# Patient Record
Sex: Female | Born: 1990 | Race: White | Hispanic: No | Marital: Married | Smoking: Never smoker
Health system: Southern US, Community
[De-identification: ages and names within clinical notes are randomized; demographics above are authoritative.]

## PROBLEM LIST (undated history)

## (undated) ENCOUNTER — Inpatient Hospital Stay (HOSPITAL_COMMUNITY): Payer: Self-pay

## (undated) DIAGNOSIS — D649 Anemia, unspecified: Secondary | ICD-10-CM

## (undated) DIAGNOSIS — R51 Headache: Secondary | ICD-10-CM

## (undated) DIAGNOSIS — O24419 Gestational diabetes mellitus in pregnancy, unspecified control: Secondary | ICD-10-CM

## (undated) DIAGNOSIS — Z8619 Personal history of other infectious and parasitic diseases: Secondary | ICD-10-CM

## (undated) DIAGNOSIS — F419 Anxiety disorder, unspecified: Secondary | ICD-10-CM

## (undated) HISTORY — DX: Gestational diabetes mellitus in pregnancy, unspecified control: O24.419

## (undated) HISTORY — DX: Anemia, unspecified: D64.9

## (undated) HISTORY — PX: DILATION AND CURETTAGE OF UTERUS: SHX78

## (undated) HISTORY — DX: Personal history of other infectious and parasitic diseases: Z86.19

## (undated) HISTORY — DX: Headache: R51

---
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
## 2012-01-09 NOTE — L&D Delivery Note (Signed)
Delivery Note At 11:10 AM a viable female was delivered via Vaginal, Spontaneous Delivery (Presentation: Left Occiput Anterior).  APGAR: 7, 8; weight 8 lb 5 oz (3771 g).   Placenta status: Intact, Spontaneous.  Cord: 3 vessels with the following complications: None.  Cord pH: NA  Anesthesia: Epidural  Episiotomy: None Lacerations: 1st degree;Perineal .. And right labial  Suture Repair: 3.0 vicryl Est. Blood Loss (mL): 250  Mom to postpartum.  Baby to nursery-stable.  Dana Bass J. 10/03/2012, 1:09 PM

## 2012-04-03 LAB — OB RESULTS CONSOLE RUBELLA ANTIBODY, IGM: Rubella: IMMUNE

## 2012-04-15 ENCOUNTER — Inpatient Hospital Stay (HOSPITAL_COMMUNITY): Admission: AD | Admit: 2012-04-15 | Payer: Self-pay | Source: Ambulatory Visit | Admitting: Obstetrics and Gynecology

## 2012-04-18 ENCOUNTER — Ambulatory Visit (HOSPITAL_COMMUNITY)
Admission: RE | Admit: 2012-04-18 | Discharge: 2012-04-18 | Disposition: A | Discharge status: Home / Self Care | Payer: Managed Care, Other (non HMO) | Source: Ambulatory Visit | Attending: Obstetrics and Gynecology | Admitting: Obstetrics and Gynecology

## 2012-04-18 ENCOUNTER — Other Ambulatory Visit: Payer: Self-pay | Admitting: Obstetrics and Gynecology

## 2012-04-18 DIAGNOSIS — N949 Unspecified condition associated with female genital organs and menstrual cycle: Secondary | ICD-10-CM

## 2012-04-18 DIAGNOSIS — Z3689 Encounter for other specified antenatal screening: Secondary | ICD-10-CM | POA: Insufficient documentation

## 2012-04-18 DIAGNOSIS — R109 Unspecified abdominal pain: Secondary | ICD-10-CM

## 2012-04-18 DIAGNOSIS — O2620 Pregnancy care for patient with recurrent pregnancy loss, unspecified trimester: Secondary | ICD-10-CM | POA: Insufficient documentation

## 2012-05-21 ENCOUNTER — Other Ambulatory Visit: Payer: Self-pay | Admitting: Obstetrics and Gynecology

## 2012-05-21 ENCOUNTER — Encounter (HOSPITAL_COMMUNITY): Payer: Self-pay | Admitting: Obstetrics and Gynecology

## 2012-05-21 DIAGNOSIS — O358XX1 Maternal care for other (suspected) fetal abnormality and damage, fetus 1: Secondary | ICD-10-CM

## 2012-05-27 ENCOUNTER — Telehealth (HOSPITAL_COMMUNITY): Payer: Self-pay | Admitting: MS"

## 2012-05-27 NOTE — Telephone Encounter (Signed)
During appointment reminder calls, patient's husband inquired about the necessity of the appointment. I called back to discuss further. He stated that he currently does not have insurance, so while they are interested in the appointment, he is trying to weigh if the visit is worth the additional cost. He stated that they are not concerned about the club foot, but if ultrasound is looking at the calcifications in the heart, then they would be interested. We discussed that the anatomy ultrasound will be looking overall at the anatomy and growth of the baby. We briefly discussed echogenic intracardiac focus does not indicate a problem with the fetal heart and that no follow-up/treatment for an EIF is required. We briefly discussed that EIF is a marker for chromosome conditions, but that is considered a stronger marker in pregnancies that are already considered to have an increased risk for these conditions. Mr. Granada expressed that he and his wife are not concerned about the possibility of a genetic condition in the pregnancy. He plans to talk with his wife, but given the expense for additional appointments currently, they may plan to follow-up with their OB only at this time. They plan to call our office back today if they would like to cancel the appointment tomorrow.   Clydie Braun Taylormarie Register 05/27/2012 9:52 AM

## 2012-05-28 ENCOUNTER — Ambulatory Visit (HOSPITAL_COMMUNITY): Payer: Managed Care, Other (non HMO)

## 2012-07-05 ENCOUNTER — Inpatient Hospital Stay (HOSPITAL_COMMUNITY)
Admission: AD | Admit: 2012-07-05 | Discharge: 2012-07-05 | Disposition: A | Discharge status: Home / Self Care | Payer: 59 | Source: Ambulatory Visit | Attending: Obstetrics and Gynecology | Admitting: Obstetrics and Gynecology

## 2012-07-05 ENCOUNTER — Encounter (HOSPITAL_COMMUNITY): Payer: Self-pay | Admitting: *Deleted

## 2012-07-05 DIAGNOSIS — N390 Urinary tract infection, site not specified: Secondary | ICD-10-CM | POA: Insufficient documentation

## 2012-07-05 DIAGNOSIS — O2390 Unspecified genitourinary tract infection in pregnancy, unspecified trimester: Secondary | ICD-10-CM | POA: Insufficient documentation

## 2012-07-05 DIAGNOSIS — B373 Candidiasis of vulva and vagina: Secondary | ICD-10-CM

## 2012-07-05 DIAGNOSIS — O4702 False labor before 37 completed weeks of gestation, second trimester: Secondary | ICD-10-CM

## 2012-07-05 DIAGNOSIS — M545 Low back pain, unspecified: Secondary | ICD-10-CM | POA: Insufficient documentation

## 2012-07-05 DIAGNOSIS — R109 Unspecified abdominal pain: Secondary | ICD-10-CM | POA: Insufficient documentation

## 2012-07-05 DIAGNOSIS — N949 Unspecified condition associated with female genital organs and menstrual cycle: Secondary | ICD-10-CM | POA: Insufficient documentation

## 2012-07-05 DIAGNOSIS — O2342 Unspecified infection of urinary tract in pregnancy, second trimester: Secondary | ICD-10-CM

## 2012-07-05 LAB — URINALYSIS, ROUTINE W REFLEX MICROSCOPIC
Glucose, UA: NEGATIVE mg/dL
Ketones, ur: NEGATIVE mg/dL
Nitrite: NEGATIVE
Protein, ur: NEGATIVE mg/dL

## 2012-07-05 LAB — OB RESULTS CONSOLE HIV ANTIBODY (ROUTINE TESTING): HIV: NONREACTIVE

## 2012-07-05 LAB — URINE MICROSCOPIC-ADD ON

## 2012-07-05 LAB — WET PREP, GENITAL: Trich, Wet Prep: NONE SEEN

## 2012-07-05 LAB — OB RESULTS CONSOLE RPR: RPR: NONREACTIVE

## 2012-07-05 LAB — OB RESULTS CONSOLE GC/CHLAMYDIA: Gonorrhea: NEGATIVE

## 2012-07-05 LAB — OB RESULTS CONSOLE ANTIBODY SCREEN: Antibody Screen: NEGATIVE

## 2012-07-05 LAB — AMNISURE RUPTURE OF MEMBRANE (ROM) NOT AT ARMC: Amnisure ROM: NEGATIVE

## 2012-07-05 MED ORDER — TERCONAZOLE 0.4 % VA CREA: 1.0000 | TOPICAL_CREAM | Freq: Every day | VAGINAL | Status: DC

## 2012-07-05 MED ORDER — NITROFURANTOIN MONOHYD MACRO 100 MG PO CAPS: 100.0000 mg | ORAL_CAPSULE | Freq: Two times a day (BID) | ORAL | Status: DC

## 2012-07-05 NOTE — MAU Provider Note (Signed)
History     CSN: [id number]  Arrival date and time: 07/05/12 1503   None     Chief Complaint  Patient presents with  . Abdominal Cramping  . Vaginal Discharge   HPI Dana Bass is a 22 y.o. G1P0 at [redacted]w[redacted]d. She presents with c/o leaking watery cloudy discharge this am. No bleeding, started having cramping this am also. She has had low back ache past few days. Good fetal movement.   OB History   Grav Para Term Preterm Abortions TAB SAB Ect Mult Living   1               History reviewed. No pertinent past medical history.  History reviewed. No pertinent past surgical history.  No family history on file.  History  Substance Use Topics  . Smoking status: Not on file  . Smokeless tobacco: Not on file  . Alcohol Use: Not on file    Allergies: Allergies not on file  No prescriptions prior to admission    Review of Systems  Constitutional: Negative for fever and chills.  Gastrointestinal: Positive for constipation. Negative for nausea, vomiting and diarrhea.  Genitourinary: Negative for dysuria, urgency, frequency, hematuria and flank pain.       Watery cloudy vaginal discharge No bleeding   Physical Exam   Blood pressure 131/69, pulse 116, temperature 98.5 F (36.9 C), temperature source Oral, resp. rate 18, height 4\' 11"  (1.499 m), weight 155 lb (70.308 kg).  Physical Exam  Constitutional: She is oriented to person, place, and time. She appears well-developed and well-nourished.  GI: Soft. She exhibits no distension. There is no tenderness. There is no rebound and no guarding.  Genitourinary:  SSE- Vulva- nl anatomy,skin intact Vagina- small amt white chunky discharge Cx- closed, long Uterus- Gravid 26-28 wk size, non tender Adn- non-tender  Musculoskeletal: Normal range of motion.  Neurological: She is alert and oriented to person, place, and time.  Skin: Skin is warm and dry.  Psychiatric: She has a normal mood and affect. Her behavior is normal.    MAU  Course  Procedures  MDM Results for orders placed during the hospital encounter of 07/05/12 (from the past 24 hour(s))  OB RESULTS CONSOLE GC/CHLAMYDIA     Status: None   Collection Time    07/05/12 12:00 AM      Result Value Range   Gonorrhea Negative     Chlamydia Negative    OB RESULTS CONSOLE RPR     Status: None   Collection Time    07/05/12 12:00 AM      Result Value Range   RPR Nonreactive    OB RESULTS CONSOLE HIV ANTIBODY (ROUTINE TESTING)     Status: None   Collection Time    07/05/12 12:00 AM      Result Value Range   HIV Non-reactive    OB RESULTS CONSOLE HEPATITIS B SURFACE ANTIGEN     Status: None   Collection Time    07/05/12 12:00 AM      Result Value Range   Hepatitis B Surface Ag Negative    OB RESULTS CONSOLE ABO/RH     Status: None   Collection Time    07/05/12 12:00 AM      Result Value Range   RH Type  Negative     ABO Grouping O    OB RESULTS CONSOLE ANTIBODY SCREEN     Status: None   Collection Time    07/05/12 12:00 AM  Result Value Range   Antibody Screen Negative    URINALYSIS, ROUTINE W REFLEX MICROSCOPIC     Status: Abnormal   Collection Time    07/05/12  3:20 PM      Result Value Range   Color, Urine YELLOW  YELLOW   APPearance HAZY (*) CLEAR   Specific Gravity, Urine 1.015  1.005 - 1.030   pH 8.0  5.0 - 8.0   Glucose, UA NEGATIVE  NEGATIVE mg/dL   Hgb urine dipstick NEGATIVE  NEGATIVE   Bilirubin Urine NEGATIVE  NEGATIVE   Ketones, ur NEGATIVE  NEGATIVE mg/dL   Protein, ur NEGATIVE  NEGATIVE mg/dL   Urobilinogen, UA 0.2  0.0 - 1.0 mg/dL   Nitrite NEGATIVE  NEGATIVE   Leukocytes, UA MODERATE (*) NEGATIVE  URINE MICROSCOPIC-ADD ON     Status: Abnormal   Collection Time    07/05/12  3:20 PM      Result Value Range   Squamous Epithelial / LPF MANY (*) RARE   WBC, UA 7-10  <3 WBC/hpf   Bacteria, UA RARE  RARE   Urine-Other MUCOUS PRESENT    WET PREP, GENITAL     Status: Abnormal   Collection Time    07/05/12  4:30 PM       Result Value Range   Yeast Wet Prep HPF POC MODERATE (*) NONE SEEN   Trich, Wet Prep NONE SEEN  NONE SEEN   Clue Cells Wet Prep HPF POC FEW (*) NONE SEEN   WBC, Wet Prep HPF POC MODERATE (*) NONE SEEN  AMNISURE RUPTURE OF MEMBRANE (ROM)     Status: None   Collection Time    07/05/12  4:30 PM      Result Value Range   Amnisure ROM NEGATIVE       Assessment and Plan  ASSESSMENT:  Vaginal yeast Urinary tract infection  26 3/67 wks, not ruptured  PLAN:  C&S on urine Macrobid bid x 7d Terazol 7 cream Keep her appt 7/2 in the office with Dr Dawayne Patricia, [place]. 07/05/2012, 3:49 PM

## 2012-07-05 NOTE — MAU Note (Signed)
Pt reports she has noticed some watery discharge today and some mild/ moderate abd cramping. Stated she has been having intermittent back pain for several days.

## 2012-07-05 NOTE — Progress Notes (Signed)
Specimens recollected

## 2012-07-05 NOTE — MAU Note (Signed)
Pt presents with occassional leaking since about 1245 today and cramping  On and off for a few days.  Pt with occassional headaches through out the pregnancy.

## 2012-07-06 ENCOUNTER — Telehealth: Payer: Self-pay | Admitting: Obstetrics and Gynecology

## 2012-07-06 NOTE — Telephone Encounter (Signed)
Phone call from pt.  Pt c/o intermittent back pain as well as cramping occurring approximately every 20 minutes since last night with increased intensity.  Also complains of watery vaginal discharge which has run down her legs on several occasions since yesterday.  Denies fever, vag itching, burning or odor.  Reports active fetus.  Pt instructed to present to MAU for evaluation.

## 2012-07-07 LAB — GC/CHLAMYDIA PROBE AMP
CT Probe RNA: NEGATIVE
GC Probe RNA: NEGATIVE

## 2012-07-08 ENCOUNTER — Encounter (HOSPITAL_COMMUNITY): Payer: Self-pay

## 2012-07-08 ENCOUNTER — Ambulatory Visit (HOSPITAL_COMMUNITY)
Admission: RE | Admit: 2012-07-08 | Discharge: 2012-07-08 | Disposition: A | Discharge status: Home / Self Care | Payer: 59 | Source: Ambulatory Visit | Attending: Obstetrics and Gynecology | Admitting: Obstetrics and Gynecology

## 2012-07-08 ENCOUNTER — Ambulatory Visit (HOSPITAL_COMMUNITY): Admission: RE | Admit: 2012-07-08 | Payer: 59 | Source: Ambulatory Visit

## 2012-07-08 DIAGNOSIS — O358XX1 Maternal care for other (suspected) fetal abnormality and damage, fetus 1: Secondary | ICD-10-CM

## 2012-07-08 DIAGNOSIS — O358XX0 Maternal care for other (suspected) fetal abnormality and damage, not applicable or unspecified: Secondary | ICD-10-CM | POA: Insufficient documentation

## 2012-07-08 DIAGNOSIS — Z363 Encounter for antenatal screening for malformations: Secondary | ICD-10-CM | POA: Insufficient documentation

## 2012-07-08 DIAGNOSIS — O360990 Maternal care for other rhesus isoimmunization, unspecified trimester, not applicable or unspecified: Secondary | ICD-10-CM | POA: Insufficient documentation

## 2012-07-08 DIAGNOSIS — O2620 Pregnancy care for patient with recurrent pregnancy loss, unspecified trimester: Secondary | ICD-10-CM | POA: Insufficient documentation

## 2012-07-08 DIAGNOSIS — Z1389 Encounter for screening for other disorder: Secondary | ICD-10-CM | POA: Insufficient documentation

## 2012-08-06 ENCOUNTER — Encounter: Payer: 59 | Attending: Obstetrics and Gynecology

## 2012-08-12 ENCOUNTER — Other Ambulatory Visit: Payer: Self-pay | Admitting: Obstetrics and Gynecology

## 2012-08-12 DIAGNOSIS — Q6601 Congenital talipes equinovarus, right foot: Secondary | ICD-10-CM

## 2012-08-12 DIAGNOSIS — R9389 Abnormal findings on diagnostic imaging of other specified body structures: Secondary | ICD-10-CM

## 2012-09-02 ENCOUNTER — Ambulatory Visit (HOSPITAL_COMMUNITY)
Admission: RE | Admit: 2012-09-02 | Discharge: 2012-09-02 | Disposition: A | Discharge status: Home / Self Care | Payer: 59 | Source: Ambulatory Visit | Attending: Obstetrics and Gynecology | Admitting: Obstetrics and Gynecology

## 2012-09-02 ENCOUNTER — Encounter (HOSPITAL_COMMUNITY): Payer: Self-pay

## 2012-09-02 DIAGNOSIS — R9389 Abnormal findings on diagnostic imaging of other specified body structures: Secondary | ICD-10-CM

## 2012-09-02 DIAGNOSIS — Q6601 Congenital talipes equinovarus, right foot: Secondary | ICD-10-CM

## 2012-09-02 DIAGNOSIS — IMO0002 Reserved for concepts with insufficient information to code with codable children: Secondary | ICD-10-CM | POA: Insufficient documentation

## 2012-09-02 DIAGNOSIS — O358XX0 Maternal care for other (suspected) fetal abnormality and damage, not applicable or unspecified: Secondary | ICD-10-CM | POA: Insufficient documentation

## 2012-09-02 NOTE — Progress Notes (Signed)
Dana Bass  was seen today for an ultrasound appointment.  See full report in [facility].  Impression: Single IUP at 34 6/7 weeks Bilateral clubfeet Mild right renal pylectrasis (7.3 cm) without calyceal dilation The estimated fetal weight today is > 90th %tile (3111g).  The AC measures > 97th %tile. Normal amniotic fluid volume  Recommendations: Follow-up ultrasounds as clinically indicated.  Recommend renal ultarsound of the newborn after delivery. Patient plans for Peds ortho evaluation following delivery.  Alpha Gula, MD

## 2012-09-04 ENCOUNTER — Inpatient Hospital Stay (HOSPITAL_COMMUNITY)
Admission: AD | Admit: 2012-09-04 | Discharge: 2012-09-05 | Disposition: A | Discharge status: Home / Self Care | Payer: 59 | Source: Ambulatory Visit | Attending: Obstetrics and Gynecology | Admitting: Obstetrics and Gynecology

## 2012-09-04 ENCOUNTER — Encounter (HOSPITAL_COMMUNITY): Payer: Self-pay | Admitting: *Deleted

## 2012-09-04 DIAGNOSIS — O2343 Unspecified infection of urinary tract in pregnancy, third trimester: Secondary | ICD-10-CM

## 2012-09-04 DIAGNOSIS — N390 Urinary tract infection, site not specified: Secondary | ICD-10-CM | POA: Insufficient documentation

## 2012-09-04 DIAGNOSIS — O2390 Unspecified genitourinary tract infection in pregnancy, unspecified trimester: Secondary | ICD-10-CM | POA: Insufficient documentation

## 2012-09-04 DIAGNOSIS — O4703 False labor before 37 completed weeks of gestation, third trimester: Secondary | ICD-10-CM

## 2012-09-04 DIAGNOSIS — O4700 False labor before 37 completed weeks of gestation, unspecified trimester: Secondary | ICD-10-CM | POA: Insufficient documentation

## 2012-09-04 DIAGNOSIS — R109 Unspecified abdominal pain: Secondary | ICD-10-CM | POA: Insufficient documentation

## 2012-09-04 DIAGNOSIS — O358XX0 Maternal care for other (suspected) fetal abnormality and damage, not applicable or unspecified: Secondary | ICD-10-CM | POA: Insufficient documentation

## 2012-09-04 HISTORY — DX: Anxiety disorder, unspecified: F41.9

## 2012-09-04 LAB — URINALYSIS, ROUTINE W REFLEX MICROSCOPIC
Bilirubin Urine: NEGATIVE
Nitrite: NEGATIVE
Specific Gravity, Urine: >1.03 — ABNORMAL HIGH (ref 1.005–1.030)
pH: 6 (ref 5.0–8.0)

## 2012-09-04 LAB — URINE MICROSCOPIC-ADD ON

## 2012-09-04 NOTE — MAU Provider Note (Signed)
Chief Complaint:  No chief complaint on file.  First Provider Initiated Contact with Patient 09/04/12 2311     HPI: Dana Bass is a 22 y.o. G4P0030 at [redacted]w[redacted]d who presents to maternity admissions reporting low abdominal pressure and questionable contractions since 7 PM. Denies leaking of fluid, vaginal bleeding, urinary complaints or vaginal discharge. No previous issues with preterm labor. Good fetal movement.   Pregnancy Course: Followed by MFM for fetal pyelectasis, suspected fetal macrosomia and bilateral clubfeet. History of frequent UTIs.  Past Medical History: Past Medical History  Diagnosis Date  . Anxiety     Past obstetric history: OB History  Gravida Para Term Preterm AB SAB TAB Ectopic Multiple Living  4    3 3         # Outcome Date GA Lbr Len/2nd Weight Sex Delivery Anes PTL Lv  4 CUR           3 SAB           2 SAB           1 SAB               Past Surgical History: Past Surgical History  Procedure Laterality Date  . Dilation and curettage of uterus    . Cholecystectomy  2010     Family History: History reviewed. No pertinent family history.  Social History: History  Substance Use Topics  . Smoking status: Never Smoker   . Smokeless tobacco: Not on file  . Alcohol Use: No    Allergies:  Allergies  Allergen Reactions  . Morphine And Related Nausea Only  . Sulfa Antibiotics Other (See Comments)    Unknown childhood rxn    Meds:  Prescriptions prior to admission  Medication Sig Dispense Refill  . acetaminophen (TYLENOL) 500 MG tablet Take 1,000 mg by mouth every 6 (six) hours as needed for pain.      Marland Kitchen FLUoxetine (PROZAC) 20 MG tablet Take 20 mg by mouth daily.      . Prenatal Vit-Fe Fumarate-FA (PRENATAL MULTIVITAMIN) TABS Take 1 tablet by mouth daily at 12 noon.      . [DISCONTINUED] nitrofurantoin, macrocrystal-monohydrate, (MACROBID) 100 MG capsule Take 1 capsule (100 mg total) by mouth 2 (two) times daily.  14 capsule  0  . [DISCONTINUED]  terconazole (TERAZOL 7) 0.4 % vaginal cream Place 1 applicator vaginally at bedtime.  45 g  0    ROS: Pertinent findings in history of present illness.  Physical Exam  Blood pressure 115/71, pulse 109, temperature 98 F (36.7 C), temperature source Oral, resp. rate 18, height 4\' 11"  (1.499 m), weight 78.019 kg (172 lb), last menstrual period 01/09/2012. GENERAL: Well-developed, well-nourished female in mild distress.  HEENT: normocephalic HEART: normal rate RESP: normal effort ABDOMEN: Soft, non-tender, gravid appropriate for gestational age. No CVA tenderness. EXTREMITIES: Nontender, no edema NEURO: alert and oriented SPECULUM EXAM: Deferred Dilation: Closed Effacement (%): Thick Cervical Position: Posterior Station: -3 Presentation: Vertex No bloody show. Exam by:: V. Jnaya Butrick,CNM  FHT:  Baseline 140, moderate variability, accelerations present, no decelerations Contractions: Frequent uterine irritability   Labs: Results for orders placed during the hospital encounter of 09/04/12 (from the past 24 hour(s))  URINALYSIS, ROUTINE W REFLEX MICROSCOPIC     Status: Abnormal   Collection Time    09/04/12  8:11 PM      Result Value Range   Color, Urine YELLOW  YELLOW   APPearance CLEAR  CLEAR   Specific Gravity, Urine >1.030 (*)  1.005 - 1.030   pH 6.0  5.0 - 8.0   Glucose, UA NEGATIVE  NEGATIVE mg/dL   Hgb urine dipstick LARGE (*) NEGATIVE   Bilirubin Urine NEGATIVE  NEGATIVE   Ketones, ur NEGATIVE  NEGATIVE mg/dL   Protein, ur 454 (*) NEGATIVE mg/dL   Urobilinogen, UA 0.2  0.0 - 1.0 mg/dL   Nitrite NEGATIVE  NEGATIVE   Leukocytes, UA SMALL (*) NEGATIVE  URINE MICROSCOPIC-ADD ON     Status: Abnormal   Collection Time    09/04/12  8:11 PM      Result Value Range   Squamous Epithelial / LPF RARE  RARE   WBC, UA 0-2  <3 WBC/hpf   RBC / HPF TOO NUMEROUS TO COUNT  <3 RBC/hpf   Bacteria, UA FEW (*) RARE   Crystals CA OXALATE CRYSTALS (*) NEGATIVE    Imaging:  NA  MAU  Course: Cramping improved with oral fluids.  Assessment: 1. Preterm contractions, third trimester   2. UTI in pregnancy, antepartum, third trimester    Plan: Discharge home per consultation with Dr. Stefano Gaul. Preterm labor precautions and fetal kick counts. Increase fluids and rest. Urine culture pending.     Follow-up Information   Follow up with Geryl Rankins, MD. (Next week as scheduled or as needed if symptoms worsen)    Specialty:  Obstetrics and Gynecology   Contact information:   [address] [place] [address] [phone]       Follow up with [facility] [place] [facility]. (As needed if symptoms worsen)    Contact information:   [address] [id number] [place] [address] [phone]       Medication List    STOP taking these medications       terconazole 0.4 % vaginal cream  Commonly known as:  TERAZOL 7      TAKE these medications       acetaminophen 500 MG tablet  Commonly known as:  TYLENOL  Take 1,000 mg by mouth every 6 (six) hours as needed for pain.     FLUoxetine 20 MG tablet  Commonly known as:  PROZAC  Take 20 mg by mouth daily.     nitrofurantoin (macrocrystal-monohydrate) 100 MG capsule  Commonly known as:  MACROBID  Take 1 capsule (100 mg total) by mouth 2 (two) times daily. Do not use after [redacted] weeks gestation.     prenatal multivitamin Tabs tablet  Take 1 tablet by mouth daily at 12 noon.       [place], CNM 09/05/2012 12:30 AM

## 2012-09-04 NOTE — MAU Note (Signed)
PT SAYS SHE STARTED HURTING SINCE  7PM-     NO VE IN OFFICE .  DENIES HSV AND MRSA.Marland Kitchen

## 2012-09-05 DIAGNOSIS — O479 False labor, unspecified: Secondary | ICD-10-CM

## 2012-09-05 MED ORDER — NITROFURANTOIN MONOHYD MACRO 100 MG PO CAPS: 100.0000 mg | ORAL_CAPSULE | Freq: Two times a day (BID) | ORAL | Status: DC

## 2012-09-13 ENCOUNTER — Telehealth: Payer: Self-pay | Admitting: Obstetrics and Gynecology

## 2012-09-13 NOTE — Telephone Encounter (Signed)
TC from pt at 36wks, concerned about increased FM today, pt states baby has been very active today and was worried that it wasn't normal. She denies any ctx, VB or LOF, denies any pain. Pt reassured and offered MAU visit for NST if remained concerned. Pt declined visit at this time.

## 2012-09-17 ENCOUNTER — Encounter: Payer: 59 | Attending: Obstetrics and Gynecology

## 2012-09-17 VITALS — Ht 59.0 in | Wt 171.9 lb

## 2012-09-17 DIAGNOSIS — O99810 Abnormal glucose complicating pregnancy: Secondary | ICD-10-CM | POA: Insufficient documentation

## 2012-09-17 DIAGNOSIS — Z713 Dietary counseling and surveillance: Secondary | ICD-10-CM | POA: Insufficient documentation

## 2012-09-18 NOTE — Progress Notes (Signed)
  Patient was seen on 09/17/12 for Gestational Diabetes self-management class at the [facility]. The following learning objectives were met by the patient during this course:   States the definition of Gestational Diabetes  States why dietary management is important in controlling blood glucose  Describes the effects each nutrient has on blood glucose levels  Demonstrates ability to create a balanced meal plan  Demonstrates carbohydrate counting   States when to check blood glucose levels  Demonstrates proper blood glucose monitoring techniques  States the effect of stress and exercise on blood glucose levels  States the importance of limiting caffeine and abstaining from alcohol and smoking  Plan:  Aim for 2 Carb Choices for breakfast, per meal (30 grams)   Aim for 3 Carb Choices for breakfast, per meal (45 grams)  Aim for 1-2 Carbs per snack 2 times daily  Consider reading food labels for Total Carbohydrate and Fat Grams of foods Consider  increasing your activity level by walking daily as tolerated Checking BG four times per day as directed by MD    Blood glucose monitor given: None, already has meter and is testing  Patient instructed to monitor glucose levels: FBS: 60 - <90 1 hour: <140 2 hour: <120  *Patient received handouts:  Nutrition Diabetes and Pregnancy  Carbohydrate Counting List  Meal Plan Work Sheet  Patient will be seen for follow-up as needed.

## 2012-09-28 ENCOUNTER — Other Ambulatory Visit: Payer: Self-pay | Admitting: Obstetrics and Gynecology

## 2012-09-30 ENCOUNTER — Encounter (HOSPITAL_COMMUNITY): Payer: Self-pay | Admitting: *Deleted

## 2012-09-30 ENCOUNTER — Telehealth (HOSPITAL_COMMUNITY): Payer: Self-pay | Admitting: *Deleted

## 2012-09-30 LAB — OB RESULTS CONSOLE GBS: GBS: POSITIVE

## 2012-09-30 NOTE — Telephone Encounter (Signed)
Preadmission screen  

## 2012-10-01 ENCOUNTER — Inpatient Hospital Stay (HOSPITAL_COMMUNITY)
Admission: RE | Admit: 2012-10-01 | Discharge: 2012-10-05 | DRG: 775 | Disposition: A | Discharge status: Home / Self Care | Payer: 59 | Source: Ambulatory Visit | Attending: Obstetrics and Gynecology | Admitting: Obstetrics and Gynecology

## 2012-10-01 DIAGNOSIS — Z22330 Carrier of Group B streptococcus: Secondary | ICD-10-CM

## 2012-10-01 DIAGNOSIS — O3660X0 Maternal care for excessive fetal growth, unspecified trimester, not applicable or unspecified: Secondary | ICD-10-CM | POA: Diagnosis present

## 2012-10-01 DIAGNOSIS — O99892 Other specified diseases and conditions complicating childbirth: Secondary | ICD-10-CM | POA: Diagnosis present

## 2012-10-01 DIAGNOSIS — D649 Anemia, unspecified: Secondary | ICD-10-CM | POA: Diagnosis not present

## 2012-10-01 DIAGNOSIS — O9903 Anemia complicating the puerperium: Secondary | ICD-10-CM | POA: Diagnosis not present

## 2012-10-01 DIAGNOSIS — O358XX0 Maternal care for other (suspected) fetal abnormality and damage, not applicable or unspecified: Secondary | ICD-10-CM | POA: Diagnosis present

## 2012-10-01 DIAGNOSIS — O99814 Abnormal glucose complicating childbirth: Principal | ICD-10-CM | POA: Diagnosis present

## 2012-10-01 LAB — CBC
HCT: 31.6 % — ABNORMAL LOW (ref 36.0–46.0)
Hemoglobin: 10.3 g/dL — ABNORMAL LOW (ref 12.0–15.0)
MCH: 27.8 pg (ref 26.0–34.0)
MCHC: 32.6 g/dL (ref 30.0–36.0)
MCV: 85.2 fL (ref 78.0–100.0)
Platelets: 284 10*3/uL (ref 150–400)
RDW: 14.9 % (ref 11.5–15.5)
WBC: 10.6 10*3/uL — ABNORMAL HIGH (ref 4.0–10.5)

## 2012-10-01 MED ORDER — OXYTOCIN BOLUS FROM INFUSION: 500.0000 mL | INTRAVENOUS | Status: DC

## 2012-10-01 MED ORDER — BUTORPHANOL TARTRATE 1 MG/ML IJ SOLN
1.0000 mg | INTRAMUSCULAR | Status: DC | PRN
  Administered 2012-10-02: 1 mg via INTRAVENOUS
  Filled 2012-10-01: qty 1

## 2012-10-01 MED ORDER — LACTATED RINGERS IV SOLN: 500.0000 mL | INTRAVENOUS | Status: DC | PRN

## 2012-10-01 MED ORDER — OXYTOCIN 40 UNITS IN LACTATED RINGERS INFUSION - SIMPLE MED: 62.5000 mL/h | INTRAVENOUS | Status: DC

## 2012-10-01 MED ORDER — OXYCODONE-ACETAMINOPHEN 5-325 MG PO TABS
1.0000 | ORAL_TABLET | ORAL | Status: DC | PRN
  Administered 2012-10-01 – 2012-10-03 (×2): 1 via ORAL
  Filled 2012-10-01 (×2): qty 1

## 2012-10-01 MED ORDER — LACTATED RINGERS IV SOLN
INTRAVENOUS | Status: DC
  Administered 2012-10-02 – 2012-10-03 (×3): via INTRAVENOUS

## 2012-10-01 MED ORDER — ACETAMINOPHEN 325 MG PO TABS: 650.0000 mg | ORAL_TABLET | ORAL | Status: DC | PRN

## 2012-10-01 MED ORDER — MISOPROSTOL 25 MCG QUARTER TABLET
25.0000 ug | ORAL_TABLET | ORAL | Status: DC | PRN
  Administered 2012-10-01: 25 ug via VAGINAL
  Filled 2012-10-01: qty 0.25
  Filled 2012-10-01: qty 1

## 2012-10-01 MED ORDER — TERBUTALINE SULFATE 1 MG/ML IJ SOLN: 0.2500 mg | Freq: Once | INTRAMUSCULAR | Status: AC | PRN

## 2012-10-01 MED ORDER — OXYTOCIN 40 UNITS IN LACTATED RINGERS INFUSION - SIMPLE MED
1.0000 m[IU]/min | INTRAVENOUS | Status: DC
  Filled 2012-10-01: qty 1000

## 2012-10-01 MED ORDER — ZOLPIDEM TARTRATE 5 MG PO TABS
5.0000 mg | ORAL_TABLET | Freq: Every evening | ORAL | Status: DC | PRN
  Administered 2012-10-02: 5 mg via ORAL
  Filled 2012-10-01: qty 1

## 2012-10-01 MED ORDER — ONDANSETRON HCL 4 MG/2ML IJ SOLN
4.0000 mg | Freq: Four times a day (QID) | INTRAMUSCULAR | Status: DC | PRN
  Administered 2012-10-02 – 2012-10-03 (×2): 4 mg via INTRAVENOUS
  Filled 2012-10-01 (×2): qty 2

## 2012-10-01 MED ORDER — CITRIC ACID-SODIUM CITRATE 334-500 MG/5ML PO SOLN
30.0000 mL | ORAL | Status: DC | PRN
  Filled 2012-10-01: qty 15

## 2012-10-01 MED ORDER — LIDOCAINE HCL (PF) 1 % IJ SOLN
30.0000 mL | INTRAMUSCULAR | Status: DC | PRN
  Filled 2012-10-01 (×2): qty 30

## 2012-10-01 MED ORDER — IBUPROFEN 600 MG PO TABS
600.0000 mg | ORAL_TABLET | Freq: Four times a day (QID) | ORAL | Status: DC | PRN
  Administered 2012-10-03: 600 mg via ORAL
  Filled 2012-10-01: qty 1

## 2012-10-02 ENCOUNTER — Encounter (HOSPITAL_COMMUNITY): Payer: Self-pay | Admitting: Anesthesiology

## 2012-10-02 ENCOUNTER — Inpatient Hospital Stay (HOSPITAL_COMMUNITY): Payer: 59 | Admitting: Anesthesiology

## 2012-10-02 LAB — TYPE AND SCREEN
ABO/RH(D): O NEG
Antibody Screen: NEGATIVE

## 2012-10-02 LAB — ABO/RH: ABO/RH(D): O NEG

## 2012-10-02 LAB — GLUCOSE, CAPILLARY
Glucose-Capillary: 87 mg/dL (ref 70–99)
Glucose-Capillary: 73 mg/dL (ref 70–99)

## 2012-10-02 MED ORDER — DIPHENHYDRAMINE HCL 50 MG/ML IJ SOLN
12.5000 mg | INTRAMUSCULAR | Status: DC | PRN
  Filled 2012-10-02: qty 1

## 2012-10-02 MED ORDER — OXYTOCIN 40 UNITS IN LACTATED RINGERS INFUSION - SIMPLE MED
1.0000 m[IU]/min | INTRAVENOUS | Status: DC
  Administered 2012-10-02: 1 m[IU]/min via INTRAVENOUS

## 2012-10-02 MED ORDER — FENTANYL 2.5 MCG/ML BUPIVACAINE 1/10 % EPIDURAL INFUSION (WH - ANES)
14.0000 mL/h | INTRAMUSCULAR | Status: DC | PRN
  Administered 2012-10-02 – 2012-10-03 (×4): 14 mL/h via EPIDURAL
  Administered 2012-10-03: 12 mL/h via EPIDURAL
  Filled 2012-10-02 (×5): qty 125

## 2012-10-02 MED ORDER — DIPHENHYDRAMINE HCL 25 MG PO CAPS
25.0000 mg | ORAL_CAPSULE | Freq: Four times a day (QID) | ORAL | Status: DC | PRN
  Administered 2012-10-02: 25 mg via ORAL
  Filled 2012-10-02: qty 1

## 2012-10-02 MED ORDER — PHENYLEPHRINE 40 MCG/ML (10ML) SYRINGE FOR IV PUSH (FOR BLOOD PRESSURE SUPPORT)
80.0000 ug | PREFILLED_SYRINGE | INTRAVENOUS | Status: DC | PRN
  Filled 2012-10-02: qty 2

## 2012-10-02 MED ORDER — DIPHENHYDRAMINE HCL 25 MG PO CAPS
25.0000 mg | ORAL_CAPSULE | Freq: Once | ORAL | Status: AC
  Administered 2012-10-02: 25 mg via ORAL
  Filled 2012-10-02: qty 1

## 2012-10-02 MED ORDER — EPHEDRINE 5 MG/ML INJ
10.0000 mg | INTRAVENOUS | Status: DC | PRN
  Filled 2012-10-02: qty 2
  Filled 2012-10-02: qty 4

## 2012-10-02 MED ORDER — PENICILLIN G POTASSIUM 5000000 UNITS IJ SOLR
5.0000 10*6.[IU] | Freq: Once | INTRAVENOUS | Status: AC
  Administered 2012-10-02: 5 10*6.[IU] via INTRAVENOUS
  Filled 2012-10-02: qty 5

## 2012-10-02 MED ORDER — PENICILLIN G POTASSIUM 5000000 UNITS IJ SOLR
2.5000 10*6.[IU] | INTRAVENOUS | Status: DC
  Administered 2012-10-02 – 2012-10-03 (×7): 2.5 10*6.[IU] via INTRAVENOUS
  Filled 2012-10-02 (×11): qty 2.5

## 2012-10-02 MED ORDER — EPHEDRINE 5 MG/ML INJ
10.0000 mg | INTRAVENOUS | Status: DC | PRN
  Filled 2012-10-02: qty 2

## 2012-10-02 MED ORDER — SODIUM BICARBONATE 8.4 % IV SOLN
INTRAVENOUS | Status: DC | PRN
  Administered 2012-10-02: 5 mL via EPIDURAL

## 2012-10-02 MED ORDER — LACTATED RINGERS IV SOLN: 500.0000 mL | Freq: Once | INTRAVENOUS | Status: DC

## 2012-10-02 MED ORDER — PHENYLEPHRINE 40 MCG/ML (10ML) SYRINGE FOR IV PUSH (FOR BLOOD PRESSURE SUPPORT)
80.0000 ug | PREFILLED_SYRINGE | INTRAVENOUS | Status: DC | PRN
  Filled 2012-10-02: qty 5
  Filled 2012-10-02: qty 2

## 2012-10-02 NOTE — Progress Notes (Signed)
Dr. Richardson Dopp was called for updates on pt status. Unable to place a second cytotec due to pt contractions. No change in plan of care at this time, continue monitoring pt and start PCN regiment for +GBS in active labor. Instructed not to start Pitocin at this time.

## 2012-10-02 NOTE — Anesthesia Preprocedure Evaluation (Signed)
Anesthesia Evaluation  Patient identified by MRN, date of birth, ID band Patient awake    Reviewed: Allergy & Precautions, H&P , Patient's Chart, lab work & pertinent test results  Airway Mallampati: II  TM Distance: >3 FB Neck ROM: full    Dental  (+) Teeth Intact   Pulmonary    breath sounds clear to auscultation       Cardiovascular  Rhythm:regular Rate:Normal     Neuro/Psych    GI/Hepatic   Endo/Other  diabetes, GestationalMorbid obesity  Renal/GU      Musculoskeletal   Abdominal   Peds  Hematology   Anesthesia Other Findings       Reproductive/Obstetrics (+) Pregnancy                             Anesthesia Physical Anesthesia Plan  ASA: II  Anesthesia Plan: Epidural   Post-op Pain Management:    Induction:   Airway Management Planned:   Additional Equipment:   Intra-op Plan:   Post-operative Plan:   Informed Consent: I have reviewed the patients History and Physical, chart, labs and discussed the procedure including the risks, benefits and alternatives for the proposed anesthesia with the patient or authorized representative who has indicated his/her understanding and acceptance.   Dental Advisory Given  Plan Discussed with:   Anesthesia Plan Comments: (Labs checked- platelets confirmed with RN in room. Fetal heart tracing, per RN, reported to be stable enough for sitting procedure. Discussed epidural, and patient consents to the procedure:  included risk of possible headache,backache, failed block, allergic reaction, and nerve injury. This patient was asked if she had any questions or concerns before the procedure started.)        Anesthesia Quick Evaluation  

## 2012-10-02 NOTE — Progress Notes (Addendum)
Dana Bass is a 21 y.o. G4P0030 at [redacted]w[redacted]d  Subjective: Comfortable s/p epidural.  Mild headache.  Objective: BP 114/70  Pulse 97  Temp(Src) 97.9 F (36.6 C) (Oral)  Resp 20  Ht 4\' 11"  (1.499 m)  Wt 77.565 kg (171 lb)  BMI 34.52 kg/m2  SpO2 98%  LMP 01/09/2012 I/O last 3 completed shifts: In: -  Out: 100 [Emesis/NG output:100] Total I/O In: -  Out: 1800 [Urine:1800]  FHT:  FHR: 120s bpm, variability: moderate,  accelerations:  Present,  decelerations:  Absent UC:   regular, every 1-2  minutes SVE:  3/60/-2 IUPC placed posteriorly due to tachysystole  Labs: Lab Results  Component Value Date   WBC 10.6* 10/01/2012   HGB 10.3* 10/01/2012   HCT 31.6* 10/01/2012   MCV 85.2 10/01/2012   PLT 284 10/01/2012    Assessment / Plan: Induction of labor due to gestational diabetes,  progressing well on pitocin  Labor: Progressing normally  Increase Pitocin if inadequate MVU. Preeclampsia:  no signs or symptoms of toxicity Fetal Wellbeing:  Category I Pain Control:  Epidural I/D:  S/p PCN x 2 doses Anticipated MOD:  NSVD  Kristoffer Bala 10/02/2012, 1:56 PM

## 2012-10-02 NOTE — Progress Notes (Signed)
This note also relates to the following rows which could not be included: Dose (milli-units/min) Oxytocin - Cannot attach notes to extension rows Rate (mL/hr) Oxytocin - Cannot attach notes to extension rows Concentration Oxytocin - Cannot attach notes to extension rows   Dr Dion Body aware of uterine tachysystole plan is to continue to increase pitocin to adequate MVU's if baby is reassuring.

## 2012-10-02 NOTE — Progress Notes (Signed)
Assessed pt at 1800. Informed by RN that increase Pitocin did not improve strength of contractions and fetal baseline had also increased. Pt was very anxious around this time.  Per RN her whole body was shaking and maternal HR was 130s.  Given Benadryl to help with anxiety and itching.  Pitocin discontinued at that time.  Upon arrival pt is resting quietly.  States she is less anxious. Physical exam deferred. EM:  150s reactive, mild early decelerations.  Contraction q 2 min.  Amplitude of contractions increased off Pitocin.  A/P: IOL.  Not in active labor yet, inadequate MVUs. S/p AROM x 5 hours.  No signs of infection. GBS+.  Continue PCN q 4 hours. Cat I tracing. Resume Pitocin if no change with next exam.

## 2012-10-02 NOTE — Anesthesia Procedure Notes (Signed)

## 2012-10-02 NOTE — H&P (Signed)
Dana Bass is a 22 y.o. female           G69 P0030 @ 68 1/7 weeks c/w a 8 weeks ultrasound admitted  for induction of labor due to gestational diabetes.  Pt also has macrosomic fetus, >90%, 8 lbs 7 oz 1 week ago.  Got cytotec once overnight and started contracting.  Pain uncontrolled with Stadol so epidural placed at 1 cm. Gestational diabetes diagnosed at 32 weeks.  Pt was unable to tolerate 3 hour GTT twice.  BS were abnormal initially but resolved with diabetic DM.  Bilateral club feet and bilateral pyelectasis, 7 mm and 1.2 cm.  Has been followed closely by MFM with serial ultrasound.  Pt has family h/o club feet.  Quad screen was negative.  Maternal Medical History:  Reason for admission: Induction  Contractions: Frequency: irregular.    Fetal activity: Perceived fetal activity is normal.    Prenatal complications: Bilateral clubfeet, pyelectasis   Prenatal Complications - Diabetes: gestational. Diabetes is managed by diet.      OB History   Grav Para Term Preterm Abortions TAB SAB Ect Mult Living   4    3  3         Past Medical History  Diagnosis Date  . Anxiety   . Headache(784.0)   . Hx of varicella   . Gestational diabetes   . Anemia    Past Surgical History  Procedure Laterality Date  . Dilation and curettage of uterus    . Cholecystectomy  2010   Family History: family history includes Asthma in her brother; Bipolar disorder in her sister; Diabetes in her father, paternal grandfather, and paternal grandmother; Heart disease in her mother; Hypertension in her father and mother; Kidney disease in her sister; Other in her mother. Social History:  reports that she has never smoked. She has never used smokeless tobacco. She reports that she does not drink alcohol or use illicit drugs.   Prenatal Transfer Tool  Maternal Diabetes: Yes:  Diabetes Type:  Diet controlled Genetic Screening: Normal Maternal Ultrasounds/Referrals: Abnormal:  Findings:   Fetal renal  pyelectasis, Other:Bilateral club feet Fetal Ultrasounds or other Referrals:  Referred to Materal Fetal Medicine  Maternal Substance Abuse:  No Significant Maternal Medications:  Meds include: Prozac Significant Maternal Lab Results:  Lab values include: Group B Strep positive, Rh negative Other Comments:  Macrosomia, Pyelectasis 1.2 cm-needs newborn renal ultrasound  ROS  Dilation: 1.5 Effacement (%): 70 Station: -1 Exam by:: Dana Bass Blood pressure 129/71, pulse 110, temperature 99 F (37.2 C), temperature source Oral, resp. rate 20, height 4\' 11"  (1.499 m), weight 77.565 kg (171 lb), last menstrual period 01/09/2012, SpO2 98.00%. Maternal Exam:  Abdomen: Estimated fetal weight is 81/2-9 lbs.   Fetal presentation: vertex  Introitus: Vulva is positive for vulval edema. Pelvis: adequate for delivery.   Cervix: Cervix evaluated by digital exam.     Fetal Exam Fetal Monitor Review: Mode: fetoscope.   Variability: moderate (6-25 bpm).   Pattern: accelerations present.    Fetal State Assessment: Category I - tracings are normal.     Physical Exam  Constitutional: She is oriented to person, place, and time. She appears well-developed and well-nourished. No distress.  HENT:  Head: Normocephalic and atraumatic.  Eyes: EOM are normal.  Neck: Normal range of motion.  Cardiovascular: Normal rate, regular rhythm and normal heart sounds.   Respiratory: Effort normal and breath sounds normal. No respiratory distress. She has no wheezes.  GI: There is no tenderness.  Musculoskeletal: She exhibits edema. She exhibits no tenderness.  Neurological: She is alert and oriented to person, place, and time.  Skin: Skin is warm and dry. She is not diaphoretic.  Psychiatric: She has a normal mood and affect.    Prenatal labs: ABO, Rh: --/--/O NEG, O NEG (09/24 2110) Antibody: NEG (09/24 2110) Rubella: Immune (03/27 0000) RPR: NON REACTIVE (09/24 2110)  HBsAg: Negative (06/28 0000)  HIV:  Non-reactive (06/28 0000)  GBS: Positive (09/23 0000)   CBG 87  Assessment/Plan: IUP at 39 0/7 weeks GDM, diet controlled Macrosomia GBS+ Rh neg  IOL.  S/p Cytotec.  Pitocin started due to contractions.  Start at 1 mU and increase by 1-2 mU CBG every 4 hours in labor.  Monitor labor closely.  If abnormal labor curve or failure to descend, proceed with c-section. PCN q 4 hours for GBS prophylaxis. Rhogam postpartum as indicated. Anxiety  Benadryl 50 mg now. Dana Bass 10/02/2012, 9:11 AM

## 2012-10-02 NOTE — Progress Notes (Signed)
cvx 3-4/70/0 MVU 95. Cat 1 tracing. Inadequate contractions.  Resume Pitocin at 1 mU.

## 2012-10-03 ENCOUNTER — Encounter (HOSPITAL_COMMUNITY): Payer: Self-pay

## 2012-10-03 LAB — GLUCOSE, CAPILLARY
Glucose-Capillary: 86 mg/dL (ref 70–99)
Glucose-Capillary: 93 mg/dL (ref 70–99)

## 2012-10-03 MED ORDER — METHYLERGONOVINE MALEATE 0.2 MG/ML IJ SOLN: 0.2000 mg | INTRAMUSCULAR | Status: DC | PRN

## 2012-10-03 MED ORDER — WITCH HAZEL-GLYCERIN EX PADS: 1.0000 "application " | MEDICATED_PAD | CUTANEOUS | Status: DC | PRN

## 2012-10-03 MED ORDER — LANOLIN HYDROUS EX OINT: TOPICAL_OINTMENT | CUTANEOUS | Status: DC | PRN

## 2012-10-03 MED ORDER — PRENATAL MULTIVITAMIN CH
1.0000 | ORAL_TABLET | Freq: Every day | ORAL | Status: DC
  Administered 2012-10-04 – 2012-10-05 (×2): 1 via ORAL
  Filled 2012-10-03 (×2): qty 1

## 2012-10-03 MED ORDER — ONDANSETRON HCL 4 MG/2ML IJ SOLN: 4.0000 mg | INTRAMUSCULAR | Status: DC | PRN

## 2012-10-03 MED ORDER — FLUOXETINE HCL 20 MG PO TABS
20.0000 mg | ORAL_TABLET | Freq: Every day | ORAL | Status: DC
  Administered 2012-10-04 (×2): 20 mg via ORAL
  Filled 2012-10-03 (×2): qty 1

## 2012-10-03 MED ORDER — SIMETHICONE 80 MG PO CHEW: 80.0000 mg | CHEWABLE_TABLET | ORAL | Status: DC | PRN

## 2012-10-03 MED ORDER — DIPHENHYDRAMINE HCL 25 MG PO CAPS: 25.0000 mg | ORAL_CAPSULE | Freq: Four times a day (QID) | ORAL | Status: DC | PRN

## 2012-10-03 MED ORDER — ZOLPIDEM TARTRATE 5 MG PO TABS: 5.0000 mg | ORAL_TABLET | Freq: Every evening | ORAL | Status: DC | PRN

## 2012-10-03 MED ORDER — ONDANSETRON HCL 4 MG PO TABS: 4.0000 mg | ORAL_TABLET | ORAL | Status: DC | PRN

## 2012-10-03 MED ORDER — OXYCODONE-ACETAMINOPHEN 5-325 MG PO TABS: 1.0000 | ORAL_TABLET | ORAL | Status: DC | PRN

## 2012-10-03 MED ORDER — TETANUS-DIPHTH-ACELL PERTUSSIS 5-2.5-18.5 LF-MCG/0.5 IM SUSP
0.5000 mL | Freq: Once | INTRAMUSCULAR | Status: AC
  Administered 2012-10-04: 0.5 mL via INTRAMUSCULAR
  Filled 2012-10-03: qty 0.5

## 2012-10-03 MED ORDER — FERROUS SULFATE 325 (65 FE) MG PO TABS
325.0000 mg | ORAL_TABLET | Freq: Two times a day (BID) | ORAL | Status: DC
  Administered 2012-10-03 – 2012-10-05 (×4): 325 mg via ORAL
  Filled 2012-10-03 (×2): qty 1

## 2012-10-03 MED ORDER — DIBUCAINE 1 % RE OINT: 1.0000 "application " | TOPICAL_OINTMENT | RECTAL | Status: DC | PRN

## 2012-10-03 MED ORDER — SENNOSIDES-DOCUSATE SODIUM 8.6-50 MG PO TABS
2.0000 | ORAL_TABLET | ORAL | Status: DC
  Administered 2012-10-04: 2 via ORAL

## 2012-10-03 MED ORDER — METHYLERGONOVINE MALEATE 0.2 MG PO TABS: 0.2000 mg | ORAL_TABLET | ORAL | Status: DC | PRN

## 2012-10-03 MED ORDER — IBUPROFEN 600 MG PO TABS
600.0000 mg | ORAL_TABLET | Freq: Four times a day (QID) | ORAL | Status: DC
  Administered 2012-10-03 – 2012-10-05 (×8): 600 mg via ORAL
  Filled 2012-10-03 (×8): qty 1

## 2012-10-03 MED ORDER — BENZOCAINE-MENTHOL 20-0.5 % EX AERO: 1.0000 "application " | INHALATION_SPRAY | CUTANEOUS | Status: DC | PRN

## 2012-10-03 NOTE — Anesthesia Postprocedure Evaluation (Signed)
Anesthesia Post Note  Patient: Dana Bass  Procedure(s) Performed: * No procedures listed *  Anesthesia type: Epidural  Patient location: Mother/Baby  Post pain: Pain level controlled  Post assessment: Post-op Vital signs reviewed  Last Vitals:  Filed Vitals:   10/03/12 1813  BP: 111/60  Pulse: 85  Temp: 36.4 C  Resp: 16    Post vital signs: Reviewed  Level of consciousness: awake  Complications: No apparent anesthesia complications Anesthesia Post-op Note  Patient: Dana Bass  Procedure(s) Performed: * No procedures listed *  Patient Location: PACU and Mother/Baby  Anesthesia Type:Epidural  Level of Consciousness: awake, alert , oriented and patient cooperative  Airway and Oxygen Therapy: Patient Spontanous Breathing  Post-op Pain: none  Post-op Assessment: Post-op Vital signs reviewed, No signs of Nausea or vomiting, Pain level controlled, No headache, No backache, No residual numbness and No residual motor weakness  Post-op Vital Signs: Reviewed and stable  Complications: No apparent anesthesia complications

## 2012-10-03 NOTE — Progress Notes (Signed)
Pt reports pain "easing", notes a difference since bolus via epidural pump

## 2012-10-04 ENCOUNTER — Encounter (HOSPITAL_COMMUNITY): Payer: Self-pay

## 2012-10-04 LAB — CBC
HCT: 27.6 % — ABNORMAL LOW (ref 36.0–46.0)
MCH: 28.4 pg (ref 26.0–34.0)
MCV: 84.1 fL (ref 78.0–100.0)
RBC: 3.28 MIL/uL — ABNORMAL LOW (ref 3.87–5.11)
WBC: 14.4 10*3/uL — ABNORMAL HIGH (ref 4.0–10.5)

## 2012-10-04 MED ORDER — RHO D IMMUNE GLOBULIN 1500 UNIT/2ML IJ SOLN
300.0000 ug | Freq: Once | INTRAMUSCULAR | Status: AC
  Administered 2012-10-04: 300 ug via INTRAMUSCULAR
  Filled 2012-10-04: qty 2

## 2012-10-04 MED ORDER — INFLUENZA VAC SPLIT QUAD 0.5 ML IM SUSP
0.5000 mL | INTRAMUSCULAR | Status: AC
  Administered 2012-10-04: 0.5 mL via INTRAMUSCULAR

## 2012-10-04 NOTE — Lactation Note (Signed)
This note was copied from the chart of Dana Dorian Renfro. Lactation Consultation Note  Patient Name: Dana Bass [id number] Date: 10/04/2012 Reason for consult: Initial assessment of this mom and baby 35 hours after delivery.  Mom has flat nipples and she was given shells for inverted/flat nipples, hand pump and sizes #20 and #24 NS by her nurse.  Baby has been latching with #20 NS for about 15 minutes, per mom but she has reddened nipples and her nurse had recommended comfort gelpads.  LC reviewed use and cleaning of gelpads, encouraged cue feedings with or without NS, shells and/or hand pump prior to feedings and comfort gelpads to nipples after feedings.  LC provided [employer] brochure and reviewed [facility] services and list of community and web site resources.     Maternal Data Formula Feeding for Exclusion: No Infant to breast within first hour of birth: No Breastfeeding delayed due to:: Other (comment) (reason not noted but baby sleepy when attempted at 2 hrs) Has patient been taught Hand Expression?: Yes (per nurse) Does the patient have breastfeeding experience prior to this delivery?: No  Feeding    LATCH Score/Interventions       Type of Nipple: Flat Intervention(s): Shells;Hand pump  Comfort (Breast/Nipple): Filling, red/small blisters or bruises, mild/mod discomfort  Problem noted: Mild/Moderate discomfort Interventions (Mild/moderate discomfort): Comfort gels;Pre-pump if needed;Hand expression  Intervention(s): Breastfeeding basics reviewed   LC will need to assess baby at breast tomorrow; per RN and mom, baby able to latch deeply with NS (#20)  Lactation Tools Discussed/Used Tools: Shells;Nipple Shields;Pump;Comfort gels Nipple shield size: 20;24;Other (comment) (provided by RN; not yet assessed by LC) Shell Type: Inverted Breast pump type: Manual Initiated by:: RN initiated   Consult Status Consult Status: Follow-up Date: 10/05/12 Follow-up type:  In-patient    Warrick Parisian [facility] 10/04/2012, 11:09 PM

## 2012-10-04 NOTE — Progress Notes (Signed)
Post Partum Day 1 Subjective: Reports feeling tired but OK otherwise.  Ambulating, voiding and tol po liquids and solids without difficulty.  Denies weakness or dizziness.  Working on breast feeding.  Pos flatus, neg BM.  Reports mild perineal pain which is relieved with medications.  Objective: Blood pressure 109/55, pulse 97, temperature 97.7 F (36.5 C), temperature source Oral, resp. rate 16, height 4\' 11"  (1.499 m), weight 77.565 kg (171 lb), last menstrual period 01/09/2012, SpO2 98.00%, unknown if currently breastfeeding.  Physical Exam:  General: alert, cooperative and no distress Heart:  RRR Lungs:  CTA bilat Abd:  Soft, NT with pos BS x 4 quads Lochia: appropriate, sm rubra Uterine Fundus: firm, NT 1 below umb Incision: healing well and repair intact.  Sm perineal edema present. DVT Evaluation: No evidence of DVT seen on physical exam. 1+ edema in bilat lower extrems present. DTRs 2+, no clonus.   Recent Labs  10/01/12 2110 10/04/12 0620  HGB 10.3* 9.3*  HCT 31.6* 27.6*    Assessment/Plan: Stable s/p vaginal delivery at 39w 2d Asymptomatic anemia  Continue current plan of care Anticipate discharge tomorrow.    LOS: 3 days   Mckinzee Spirito O. 10/04/2012, 8:23 AM

## 2012-10-05 LAB — RH IG WORKUP (INCLUDES ABO/RH)
Fetal Screen: NEGATIVE
Unit division: 0

## 2012-10-05 MED ORDER — OXYCODONE-ACETAMINOPHEN 5-325 MG PO TABS: 1.0000 | ORAL_TABLET | ORAL | Status: DC | PRN

## 2012-10-05 MED ORDER — FERROUS SULFATE 325 (65 FE) MG PO TABS: 325.0000 mg | ORAL_TABLET | Freq: Two times a day (BID) | ORAL | Status: AC

## 2012-10-05 MED ORDER — IBUPROFEN 600 MG PO TABS: 600.0000 mg | ORAL_TABLET | Freq: Four times a day (QID) | ORAL | Status: DC

## 2012-10-05 NOTE — Progress Notes (Signed)

## 2012-10-05 NOTE — Discharge Summary (Signed)
Vaginal Delivery Discharge Summary  Dana Bass  DOB:    1990-05-20 MRN:    [id number] CSN:    [id number]  Date of admission:                  10/01/12  Date of discharge:                   10/05/12  Procedures this admission: NSVD with a 1st degree and labial  Date of Delivery: 10/03/12 by Dr. Richardson Dopp  Newborn Data:  Live born female  Birth Weight: 8 lb 5 oz (3771 g) APGAR: 7, 8  Home with mother. Circumcision Plan: Declined  History of Present Illness:  Ms. Dana Bass is a 22 y.o. female, [phone], who presents at [redacted]w[redacted]d weeks gestation. The patient has been followed at the [facility]cology division of [facility]. She was admitted induction of labor for GDM and macrosomnia. Her pregnancy has been complicated by: GDM, macrosomina, bilateral clubfeet, pyelectasis, and anxiety.  Hospital course:  The patient was admitted for IOL for GDM and macrosomnia.   Her labor was not complicated. She proceeded to have a vaginal delivery of a healthy infant. Her delivery was not complicated. Her postpartum course was not complicated.  She was discharged to home on postpartum day 2 doing well.  Feeding:  breast  Contraception:  Depo-Provera  Discharge hemoglobin:  Hemoglobin  Date Value Range Status  10/04/2012 9.3* 12.0 - 15.0 g/dL Final     HCT  Date Value Range Status  10/04/2012 27.6* 36.0 - 46.0 % Final    Discharge Physical Exam:   General: alert, cooperative and no distress Lochia: appropriate Uterine Fundus: firm Incision: healing well DVT Evaluation: No evidence of DVT seen on physical exam. Negative Homan's sign.  Intrapartum Procedures: spontaneous vaginal delivery and GBS prophylaxis Postpartum Procedures: none Complications-Operative and Postpartum: 1st degree perineal laceration  Discharge Diagnoses: Term Pregnancy-delivered, anemia  Discharge Information:  Activity:           pelvic rest Diet:                 routine Medications: PNV, Ibuprofen, Iron and Percocet Condition:      stable Instructions:   Postpartum Care After Vaginal Delivery  After you deliver your newborn (postpartum period), the usual stay in the hospital is 24 72 hours. If there were problems with your labor or delivery, or if you have other medical problems, you might be in the hospital longer.  While you are in the hospital, you will receive help and instructions on how to care for yourself and your newborn during the postpartum period.  While you are in the hospital:  Be sure to tell your nurses if you have pain or discomfort, as well as where you feel the pain and what makes the pain worse.  If you had an incision made near your vagina (episiotomy) or if you had some tearing during delivery, the nurses may put ice packs on your episiotomy or tear. The ice packs may help to reduce the pain and swelling.  If you are breastfeeding, you may feel uncomfortable contractions of your uterus for a couple of weeks. This is normal. The contractions help your uterus get back to normal size.  It is normal to have some bleeding after delivery.  For the first 1 3 days after delivery, the flow is red and the amount may be similar to a period.  It is common for  the flow to start and stop.  In the first few days, you may pass some small clots. Let your nurses know if you begin to pass large clots or your flow increases.  Do not  flush blood clots down the toilet before having the nurse look at them.  During the next 3 10 days after delivery, your flow should become more watery and pink or brown-tinged in color.  Ten to fourteen days after delivery, your flow should be a small amount of yellowish-white discharge.  The amount of your flow will decrease over the first few weeks after delivery. Your flow may stop in 6 8 weeks. Most women have had their flow stop by 12 weeks after delivery.  You should change your sanitary pads  frequently.  Wash your hands thoroughly with soap and water for at least 20 seconds after changing pads, using the toilet, or before holding or feeding your newborn.  You should feel like you need to empty your bladder within the first 6 8 hours after delivery.  In case you become weak, lightheaded, or faint, call your nurse before you get out of bed for the first time and before you take a shower for the first time.  Within the first few days after delivery, your breasts may begin to feel tender and full. This is called engorgement. Breast tenderness usually goes away within 48 72 hours after engorgement occurs. You may also notice milk leaking from your breasts. If you are not breastfeeding, do not stimulate your breasts. Breast stimulation can make your breasts produce more milk.  Spending as much time as possible with your newborn is very important. During this time, you and your newborn can feel close and get to know each other. Having your newborn stay in your room (rooming in) will help to strengthen the bond with your newborn. It will give you time to get to know your newborn and become comfortable caring for your newborn.  Your hormones change after delivery. Sometimes the hormone changes can temporarily cause you to feel sad or tearful. These feelings should not last more than a few days. If these feelings last longer than that, you should talk to your caregiver.  If desired, talk to your caregiver about methods of family planning or contraception.  Talk to your caregiver about immunizations. Your caregiver may want you to have the following immunizations before leaving the hospital:  Tetanus, diphtheria, and pertussis (Tdap) or tetanus and diphtheria (Td) immunization. It is very important that you and your family (including grandparents) or others caring for your newborn are up-to-date with the Tdap or Td immunizations. The Tdap or Td immunization can help protect your newborn from  getting ill.  Rubella immunization.  Varicella (chickenpox) immunization.  Influenza immunization. You should receive this annual immunization if you did not receive the immunization during your pregnancy. Document Released: 10/22/2006 Document Revised: 09/19/2011 Document Reviewed: 08/22/2011 [facility] Patient Information 2014 [place], [place].   Postpartum Depression and Baby Blues  The postpartum period begins right after the birth of a baby. During this time, there is often a great amount of joy and excitement. It is also a time of considerable changes in the life of the parent(s). Regardless of how many times a mother gives birth, each child brings new challenges and dynamics to the family. It is not unusual to have feelings of excitement accompanied by confusing shifts in moods, emotions, and thoughts. All mothers are at risk of developing postpartum depression or the "baby  blues." These mood changes can occur right after giving birth, or they may occur many months after giving birth. The baby blues or postpartum depression can be mild or severe. Additionally, postpartum depression can resolve rather quickly, or it can be a long-term condition. CAUSES Elevated hormones and their rapid decline are thought to be a main cause of postpartum depression and the baby blues. There are a number of hormones that radically change during and after pregnancy. Estrogen and progesterone usually decrease immediately after delivering your baby. The level of thyroid hormone and various cortisol steroids also rapidly drop. Other factors that play a major role in these changes include major life events and genetics.  RISK FACTORS If you have any of the following risks for the baby blues or postpartum depression, know what symptoms to watch out for during the postpartum period. Risk factors that may increase the likelihood of getting the baby blues or postpartum depression include:  Havinga personal or family  history of depression.  Having depression while being pregnant.  Having premenstrual or oral contraceptive-associated mood issues.  Having exceptional life stress.  Having marital conflict.  Lacking a social support network.  Having a baby with special needs.  Having health problems such as diabetes. SYMPTOMS Baby blues symptoms include:  Brief fluctuations in mood, such as going from extreme happiness to sadness.  Decreased concentration.  Difficulty sleeping.  Crying spells, tearfulness.  Irritability.  Anxiety. Postpartum depression symptoms typically begin within the first month after giving birth. These symptoms include:  Difficulty sleeping or excessive sleepiness.  Marked weight loss.  Agitation.  Feelings of worthlessness.  Lack of interest in activity or food. Postpartum psychosis is a very concerning condition and can be dangerous. Fortunately, it is rare. Displaying any of the following symptoms is cause for immediate medical attention. Postpartum psychosis symptoms include:  Hallucinations and delusions.  Bizarre or disorganized behavior.  Confusion or disorientation. DIAGNOSIS  A diagnosis is made by an evaluation of your symptoms. There are no medical or lab tests that lead to a diagnosis, but there are various questionnaires that a caregiver may use to identify those with the baby blues, postpartum depression, or psychosis. Often times, a screening tool called the [place] Postnatal Depression Scale is used to diagnose depression in the postpartum period.  TREATMENT The baby blues usually goes away on its own in 1 to 2 weeks. Social support is often all that is needed. You should be encouraged to get adequate sleep and rest. Occasionally, you may be given medicines to help you sleep.  Postpartum depression requires treatment as it can last several months or longer if it is not treated. Treatment may include individual or group therapy, medicine, or  both to address any social, physiological, and psychological factors that may play a role in the depression. Regular exercise, a healthy diet, rest, and social support may also be strongly recommended.  Postpartum psychosis is more serious and needs treatment right away. Hospitalization is often needed. HOME CARE INSTRUCTIONS  Get as much rest as you can. Nap when the baby sleeps.  Exercise regularly. Some women find yoga and walking to be beneficial.  Eat a balanced and nourishing diet.  Do little things that you enjoy. Have a cup of tea, take a bubble bath, read your favorite magazine, or listen to your favorite music.  Avoid alcohol.  Ask for help with household chores, cooking, grocery shopping, or running errands as needed. Do not try to do everything.  Talk to people  close to you about how you are feeling. Get support from your partner, family members, friends, or other new moms.  Try to stay positive in how you think. Think about the things you are grateful for.  Do not spend a lot of time alone.  Only take medicine as directed by your caregiver.  Keep all your postpartum appointments.  Let your caregiver know if you have any concerns. SEEK MEDICAL CARE IF: You are having a reaction or problems with your medicine. SEEK IMMEDIATE MEDICAL CARE IF:  You have suicidal feelings.  You feel you may harm the baby or someone else. Document Released: 09/29/2003 Document Revised: 03/19/2011 Document Reviewed: 10/31/2010 [facility] Patient Information 2014 [place], [place].   Discharge to: home  Follow-up Information   Follow up with [facility]. Schedule an appointment as soon as possible for a visit in 2 weeks. (Call with any questions or concerns.)    Contact information:   [address] [place] [address] [phone]       Haroldine Laws 10/05/2012

## 2013-09-24 LAB — OB RESULTS CONSOLE RUBELLA ANTIBODY, IGM: RUBELLA: IMMUNE

## 2013-09-24 LAB — OB RESULTS CONSOLE RPR: RPR: NONREACTIVE

## 2013-09-24 LAB — OB RESULTS CONSOLE HIV ANTIBODY (ROUTINE TESTING): HIV: NONREACTIVE

## 2013-09-24 LAB — OB RESULTS CONSOLE HEPATITIS B SURFACE ANTIGEN: HEP B S AG: NEGATIVE

## 2013-09-24 LAB — OB RESULTS CONSOLE ANTIBODY SCREEN: Antibody Screen: NEGATIVE

## 2013-09-24 LAB — OB RESULTS CONSOLE ABO/RH: RH Type: NEGATIVE

## 2013-10-21 ENCOUNTER — Other Ambulatory Visit (HOSPITAL_COMMUNITY)
Admission: RE | Admit: 2013-10-21 | Discharge: 2013-10-21 | Disposition: A | Discharge status: Home / Self Care | Payer: BC Managed Care – PPO | Source: Ambulatory Visit | Attending: Obstetrics and Gynecology | Admitting: Obstetrics and Gynecology

## 2013-10-21 ENCOUNTER — Other Ambulatory Visit: Payer: Self-pay | Admitting: Obstetrics and Gynecology

## 2013-10-21 DIAGNOSIS — Z01419 Encounter for gynecological examination (general) (routine) without abnormal findings: Secondary | ICD-10-CM | POA: Insufficient documentation

## 2013-10-21 LAB — OB RESULTS CONSOLE GC/CHLAMYDIA
CHLAMYDIA, DNA PROBE: NEGATIVE
Gonorrhea: NEGATIVE

## 2013-10-22 LAB — CYTOLOGY - PAP

## 2013-11-09 ENCOUNTER — Encounter (HOSPITAL_COMMUNITY): Payer: Self-pay

## 2014-01-08 NOTE — L&D Delivery Note (Signed)
Operative Delivery Note At 5:52 PM a viable female was delivered via Vaginal, Vacuum [employer], operational(Extractor).  Presentation: vertex; Position: Occiput,, Posterior; asynclitic Station: +3.  Verbal consent: obtained from patient.  Risks and benefits discussed in detail.  Risks include, but are not limited to the risks of anesthesia, bleeding, infection, damage to maternal tissues, fetal cephalhematoma.  There is also the risk of inability to effect vaginal delivery of the head, or shoulder dystocia that cannot be resolved by established maneuvers, leading to the need for emergency cesarean section.   Kiwi vacuum applied at +3 station,  2 pulls to green zone with maternal pushing.  No pop offs.    APGAR: 7, 9; weight 7#8oz. Placenta status: Intact, Spontaneous.   Cord: 3 vessels with the following complications: None.    Anesthesia: Epidural  Episiotomy: None Lacerations: Vaginal;1st degree Suture Repair: 3.0 vicryl Est. Blood Loss (mL):  350cc  Mom to postpartum.  Baby to Couplet care / Skin to Skin.  Myna HidalgoZAN, Arpi Diebold, M 04/21/2014, 6:12 PM

## 2014-04-02 LAB — OB RESULTS CONSOLE GBS: GBS: POSITIVE

## 2014-04-12 ENCOUNTER — Encounter (HOSPITAL_COMMUNITY): Payer: Self-pay | Admitting: *Deleted

## 2014-04-12 ENCOUNTER — Inpatient Hospital Stay (HOSPITAL_COMMUNITY)
Admission: AD | Admit: 2014-04-12 | Discharge: 2014-04-12 | Disposition: A | Discharge status: Home / Self Care | Payer: BLUE CROSS/BLUE SHIELD | Source: Ambulatory Visit | Attending: Obstetrics and Gynecology | Admitting: Obstetrics and Gynecology

## 2014-04-12 DIAGNOSIS — R102 Pelvic and perineal pain: Secondary | ICD-10-CM | POA: Diagnosis present

## 2014-04-12 DIAGNOSIS — Z3A38 38 weeks gestation of pregnancy: Secondary | ICD-10-CM | POA: Diagnosis not present

## 2014-04-12 LAB — URINE MICROSCOPIC-ADD ON

## 2014-04-12 LAB — URINALYSIS, ROUTINE W REFLEX MICROSCOPIC
Bilirubin Urine: NEGATIVE
GLUCOSE, UA: NEGATIVE mg/dL
Ketones, ur: 15 mg/dL — AB
Nitrite: NEGATIVE
PH: 6 (ref 5.0–8.0)
Protein, ur: 30 mg/dL — AB
Specific Gravity, Urine: >1.03 — ABNORMAL HIGH (ref 1.005–1.030)
Urobilinogen, UA: 0.2 mg/dL (ref 0.0–1.0)

## 2014-04-12 MED ORDER — NITROFURANTOIN MONOHYD MACRO 100 MG PO CAPS: 100.0000 mg | ORAL_CAPSULE | Freq: Two times a day (BID) | ORAL | Status: DC

## 2014-04-12 NOTE — MAU Note (Signed)
Pt has an appointment with Dr. Raelyn MoraVardaro today and will discuss issues with increased pelvic pain. Pt has had pelvic pain throughout third trimester but states that this pain has been more intense and constant.

## 2014-04-12 NOTE — Discharge Instructions (Signed)
Fetal Movement Counts °Patient Name: __________________________________________________ Patient Due Date: ____________________ °Performing a fetal movement count is highly recommended in high-risk pregnancies, but it is good for every pregnant woman to do. Your health care provider may ask you to start counting fetal movements at 28 weeks of the pregnancy. Fetal movements often increase: °· After eating a full meal. °· After physical activity. °· After eating or drinking something sweet or cold. °· At rest. °Pay attention to when you feel the baby is most active. This will help you notice a pattern of your baby's sleep and wake cycles and what factors contribute to an increase in fetal movement. It is important to perform a fetal movement count at the same time each day when your baby is normally most active.  °HOW TO COUNT FETAL MOVEMENTS °1. Find a quiet and comfortable area to sit or lie down on your left side. Lying on your left side provides the best blood and oxygen circulation to your baby. °2. Write down the day and time on a sheet of paper or in a journal. °3. Start counting kicks, flutters, swishes, rolls, or jabs in a 2-hour period. You should feel at least 10 movements within 2 hours. °4. If you do not feel 10 movements in 2 hours, wait 2-3 hours and count again. Look for a change in the pattern or not enough counts in 2 hours. °SEEK MEDICAL CARE IF: °· You feel less than 10 counts in 2 hours, tried twice. °· There is no movement in over an hour. °· The pattern is changing or taking longer each day to reach 10 counts in 2 hours. °· You feel the baby is not moving as he or she usually does. °Date: ____________ Movements: ____________ Start time: ____________ Finish time: ____________  °Date: ____________ Movements: ____________ Start time: ____________ Finish time: ____________ °Date: ____________ Movements: ____________ Start time: ____________ Finish time: ____________ °Date: ____________ Movements:  ____________ Start time: ____________ Finish time: ____________ °Date: ____________ Movements: ____________ Start time: ____________ Finish time: ____________ °Date: ____________ Movements: ____________ Start time: ____________ Finish time: ____________ °Date: ____________ Movements: ____________ Start time: ____________ Finish time: ____________ °Date: ____________ Movements: ____________ Start time: ____________ Finish time: ____________  °Date: ____________ Movements: ____________ Start time: ____________ Finish time: ____________ °Date: ____________ Movements: ____________ Start time: ____________ Finish time: ____________ °Date: ____________ Movements: ____________ Start time: ____________ Finish time: ____________ °Date: ____________ Movements: ____________ Start time: ____________ Finish time: ____________ °Date: ____________ Movements: ____________ Start time: ____________ Finish time: ____________ °Date: ____________ Movements: ____________ Start time: ____________ Finish time: ____________ °Date: ____________ Movements: ____________ Start time: ____________ Finish time: ____________  °Date: ____________ Movements: ____________ Start time: ____________ Finish time: ____________ °Date: ____________ Movements: ____________ Start time: ____________ Finish time: ____________ °Date: ____________ Movements: ____________ Start time: ____________ Finish time: ____________ °Date: ____________ Movements: ____________ Start time: ____________ Finish time: ____________ °Date: ____________ Movements: ____________ Start time: ____________ Finish time: ____________ °Date: ____________ Movements: ____________ Start time: ____________ Finish time: ____________ °Date: ____________ Movements: ____________ Start time: ____________ Finish time: ____________  °Date: ____________ Movements: ____________ Start time: ____________ Finish time: ____________ °Date: ____________ Movements: ____________ Start time: ____________ Finish  time: ____________ °Date: ____________ Movements: ____________ Start time: ____________ Finish time: ____________ °Date: ____________ Movements: ____________ Start time: ____________ Finish time: ____________ °Date: ____________ Movements: ____________ Start time: ____________ Finish time: ____________ °Date: ____________ Movements: ____________ Start time: ____________ Finish time: ____________ °Date: ____________ Movements: ____________ Start time: ____________ Finish time: ____________  °Date: ____________ Movements: ____________ Start time: ____________ Finish   time: ____________ Date: ____________ Movements: ____________ Start time: ____________ Doreatha Martin time: ____________ Date: ____________ Movements: ____________ Start time: ____________ Doreatha Martin time: ____________ Date: ____________ Movements: ____________ Start time: ____________ Doreatha Martin time: ____________ Date: ____________ Movements: ____________ Start time: ____________ Doreatha Martin time: ____________ Date: ____________ Movements: ____________ Start time: ____________ Doreatha Martin time: ____________ Date: ____________ Movements: ____________ Start time: ____________ Doreatha Martin time: ____________  Date: ____________ Movements: ____________ Start time: ____________ Doreatha Martin time: ____________ Date: ____________ Movements: ____________ Start time: ____________ Doreatha Martin time: ____________ Date: ____________ Movements: ____________ Start time: ____________ Doreatha Martin time: ____________ Date: ____________ Movements: ____________ Start time: ____________ Doreatha Martin time: ____________ Date: ____________ Movements: ____________ Start time: ____________ Doreatha Martin time: ____________ Date: ____________ Movements: ____________ Start time: ____________ Doreatha Martin time: ____________ Date: ____________ Movements: ____________ Start time: ____________ Doreatha Martin time: ____________  Date: ____________ Movements: ____________ Start time: ____________ Doreatha Martin time: ____________ Date: ____________  Movements: ____________ Start time: ____________ Doreatha Martin time: ____________ Date: ____________ Movements: ____________ Start time: ____________ Doreatha Martin time: ____________ Date: ____________ Movements: ____________ Start time: ____________ Doreatha Martin time: ____________ Date: ____________ Movements: ____________ Start time: ____________ Doreatha Martin time: ____________ Date: ____________ Movements: ____________ Start time: ____________ Doreatha Martin time: ____________ Date: ____________ Movements: ____________ Start time: ____________ Doreatha Martin time: ____________  Date: ____________ Movements: ____________ Start time: ____________ Doreatha Martin time: ____________ Date: ____________ Movements: ____________ Start time: ____________ Doreatha Martin time: ____________ Date: ____________ Movements: ____________ Start time: ____________ Doreatha Martin time: ____________ Date: ____________ Movements: ____________ Start time: ____________ Doreatha Martin time: ____________ Date: ____________ Movements: ____________ Start time: ____________ Doreatha Martin time: ____________ Date: ____________ Movements: ____________ Start time: ____________ Doreatha Martin time: ____________ Document Released: 01/24/2006 Document Revised: 05/11/2013 Document Reviewed: 10/22/2011 ExitCare Patient Information 2015 [employer]. This information is not intended to replace advice given to you by your health care provider. Make sure you discuss any questions you have with your health care provider. Urinary Tract Infection Urinary tract infections (UTIs) can develop anywhere along your urinary tract. Your urinary tract is your body's drainage system for removing wastes and extra water. Your urinary tract includes two kidneys, two ureters, a bladder, and a urethra. Your kidneys are a pair of bean-shaped organs. Each kidney is about the size of your fist. They are located below your ribs, one on each side of your spine. CAUSES Infections are caused by microbes, which are microscopic organisms, including  fungi, viruses, and bacteria. These organisms are so small that they can only be seen through a microscope. Bacteria are the microbes that most commonly cause UTIs. SYMPTOMS  Symptoms of UTIs may vary by age and gender of the patient and by the location of the infection. Symptoms in young women typically include a frequent and intense urge to urinate and a painful, burning feeling in the bladder or urethra during urination. Older women and men are more likely to be tired, shaky, and weak and have muscle aches and abdominal pain. A fever may mean the infection is in your kidneys. Other symptoms of a kidney infection include pain in your back or sides below the ribs, nausea, and vomiting. DIAGNOSIS To diagnose a UTI, your caregiver will ask you about your symptoms. Your caregiver also will ask to provide a urine sample. The urine sample will be tested for bacteria and white blood cells. White blood cells are made by your body to help fight infection. TREATMENT  Typically, UTIs can be treated with medication. Because most UTIs are caused by a bacterial infection, they usually can be treated with the use of antibiotics. The choice of antibiotic and length of treatment depend on  your symptoms and the type of bacteria causing your infection. HOME CARE INSTRUCTIONS  If you were prescribed antibiotics, take them exactly as your caregiver instructs you. Finish the medication even if you feel better after you have only taken some of the medication.  Drink enough water and fluids to keep your urine clear or pale yellow.  Avoid caffeine, tea, and carbonated beverages. They tend to irritate your bladder.  Empty your bladder often. Avoid holding urine for long periods of time.  Empty your bladder before and after sexual intercourse.  After a bowel movement, women should cleanse from front to back. Use each tissue only once. SEEK MEDICAL CARE IF:  5. You have back pain. 6. You develop a fever. 7. Your  symptoms do not begin to resolve within 3 days. SEEK IMMEDIATE MEDICAL CARE IF:   You have severe back pain or lower abdominal pain.  You develop chills.  You have nausea or vomiting.  You have continued burning or discomfort with urination. MAKE SURE YOU:   Understand these instructions.  Will watch your condition.  Will get help right away if you are not doing well or get worse. Document Released: 10/04/2004 Document Revised: 06/26/2011 Document Reviewed: 02/02/2011 [facility] Center[facility] Patient Information 2015 [employer], [place]. This information is not intended to replace advice given to you by your health care provider. Make sure you discuss any questions you have with your health care provider.

## 2014-04-12 NOTE — Progress Notes (Signed)
Dr Dion BodyVarnado notified of pt's urine results, orders received

## 2014-04-12 NOTE — MAU Note (Signed)
Pt reports lower pelvic pain, unsure is this is contractions. Denies bleeding or ROM

## 2014-04-12 NOTE — MAU Note (Signed)
Pt to be observed until urine results are back, if negative, patient may go home. Pt's vag exam communicated to Dr. Richardson Doppole.

## 2014-04-12 NOTE — MAU Note (Signed)
Paged to give report.

## 2014-04-19 ENCOUNTER — Other Ambulatory Visit: Payer: Self-pay | Admitting: Obstetrics and Gynecology

## 2014-04-20 ENCOUNTER — Encounter (HOSPITAL_COMMUNITY): Payer: Self-pay

## 2014-04-20 ENCOUNTER — Encounter (HOSPITAL_COMMUNITY): Payer: Self-pay | Admitting: *Deleted

## 2014-04-20 ENCOUNTER — Other Ambulatory Visit: Payer: Self-pay | Admitting: Obstetrics and Gynecology

## 2014-04-20 ENCOUNTER — Telehealth (HOSPITAL_COMMUNITY): Payer: Self-pay | Admitting: *Deleted

## 2014-04-20 ENCOUNTER — Inpatient Hospital Stay (HOSPITAL_COMMUNITY)
Admission: RE | Admit: 2014-04-20 | Discharge: 2014-04-23 | DRG: 775 | Disposition: A | Discharge status: Home / Self Care | Payer: BLUE CROSS/BLUE SHIELD | Source: Ambulatory Visit | Attending: Obstetrics and Gynecology | Admitting: Obstetrics and Gynecology

## 2014-04-20 DIAGNOSIS — F419 Anxiety disorder, unspecified: Secondary | ICD-10-CM | POA: Diagnosis present

## 2014-04-20 DIAGNOSIS — O24419 Gestational diabetes mellitus in pregnancy, unspecified control: Secondary | ICD-10-CM | POA: Diagnosis present

## 2014-04-20 DIAGNOSIS — Z3A39 39 weeks gestation of pregnancy: Secondary | ICD-10-CM | POA: Diagnosis present

## 2014-04-20 DIAGNOSIS — O99344 Other mental disorders complicating childbirth: Secondary | ICD-10-CM | POA: Diagnosis present

## 2014-04-20 DIAGNOSIS — O2492 Unspecified diabetes mellitus in childbirth: Secondary | ICD-10-CM | POA: Diagnosis present

## 2014-04-20 DIAGNOSIS — O99824 Streptococcus B carrier state complicating childbirth: Secondary | ICD-10-CM | POA: Diagnosis present

## 2014-04-20 DIAGNOSIS — O3663X0 Maternal care for excessive fetal growth, third trimester, not applicable or unspecified: Secondary | ICD-10-CM | POA: Diagnosis present

## 2014-04-20 DIAGNOSIS — O24420 Gestational diabetes mellitus in childbirth, diet controlled: Secondary | ICD-10-CM | POA: Diagnosis present

## 2014-04-20 LAB — CBC
HCT: 34.7 % — ABNORMAL LOW (ref 36.0–46.0)
Hemoglobin: 11.6 g/dL — ABNORMAL LOW (ref 12.0–15.0)
MCH: 28.6 pg (ref 26.0–34.0)
MCHC: 33.4 g/dL (ref 30.0–36.0)
MCV: 85.7 fL (ref 78.0–100.0)
PLATELETS: 274 10*3/uL (ref 150–400)
RBC: 4.05 MIL/uL (ref 3.87–5.11)
RDW: 16.2 % — ABNORMAL HIGH (ref 11.5–15.5)
WBC: 11.1 10*3/uL — AB (ref 4.0–10.5)

## 2014-04-20 LAB — GLUCOSE, CAPILLARY: GLUCOSE-CAPILLARY: 90 mg/dL (ref 70–99)

## 2014-04-20 MED ORDER — BUTORPHANOL TARTRATE 1 MG/ML IJ SOLN
1.0000 mg | INTRAMUSCULAR | Status: DC | PRN
  Administered 2014-04-21 (×3): 1 mg via INTRAVENOUS
  Filled 2014-04-20 (×3): qty 1

## 2014-04-20 MED ORDER — PENICILLIN G POTASSIUM 5000000 UNITS IJ SOLR
2.5000 10*6.[IU] | INTRAVENOUS | Status: DC
  Administered 2014-04-21 (×2): 2.5 10*6.[IU] via INTRAVENOUS
  Filled 2014-04-20 (×5): qty 2.5

## 2014-04-20 MED ORDER — LACTATED RINGERS IV SOLN
INTRAVENOUS | Status: DC
  Administered 2014-04-20 – 2014-04-21 (×3): via INTRAVENOUS

## 2014-04-20 MED ORDER — OXYCODONE-ACETAMINOPHEN 5-325 MG PO TABS: 1.0000 | ORAL_TABLET | ORAL | Status: DC | PRN

## 2014-04-20 MED ORDER — MISOPROSTOL 25 MCG QUARTER TABLET
25.0000 ug | ORAL_TABLET | ORAL | Status: DC | PRN
  Administered 2014-04-20 – 2014-04-21 (×2): 25 ug via VAGINAL
  Filled 2014-04-20 (×2): qty 0.25
  Filled 2014-04-20: qty 1

## 2014-04-20 MED ORDER — ACETAMINOPHEN 325 MG PO TABS: 650.0000 mg | ORAL_TABLET | ORAL | Status: DC | PRN

## 2014-04-20 MED ORDER — ZOLPIDEM TARTRATE 5 MG PO TABS
5.0000 mg | ORAL_TABLET | Freq: Every evening | ORAL | Status: DC | PRN
  Administered 2014-04-20: 5 mg via ORAL
  Filled 2014-04-20: qty 1

## 2014-04-20 MED ORDER — LIDOCAINE HCL (PF) 1 % IJ SOLN
30.0000 mL | INTRAMUSCULAR | Status: DC | PRN
  Filled 2014-04-20: qty 30

## 2014-04-20 MED ORDER — LACTATED RINGERS IV SOLN: 500.0000 mL | INTRAVENOUS | Status: DC | PRN

## 2014-04-20 MED ORDER — HYDROXYZINE HCL 50 MG PO TABS
50.0000 mg | ORAL_TABLET | Freq: Four times a day (QID) | ORAL | Status: DC | PRN
  Filled 2014-04-20: qty 1

## 2014-04-20 MED ORDER — ONDANSETRON HCL 4 MG/2ML IJ SOLN
4.0000 mg | Freq: Four times a day (QID) | INTRAMUSCULAR | Status: DC | PRN
  Administered 2014-04-21: 4 mg via INTRAVENOUS
  Filled 2014-04-20: qty 2

## 2014-04-20 MED ORDER — OXYTOCIN BOLUS FROM INFUSION: 500.0000 mL | INTRAVENOUS | Status: DC

## 2014-04-20 MED ORDER — PENICILLIN G POTASSIUM 5000000 UNITS IJ SOLR
5.0000 10*6.[IU] | Freq: Once | INTRAVENOUS | Status: AC
  Administered 2014-04-21: 5 10*6.[IU] via INTRAVENOUS
  Filled 2014-04-20: qty 5

## 2014-04-20 MED ORDER — TERBUTALINE SULFATE 1 MG/ML IJ SOLN: 0.2500 mg | Freq: Once | INTRAMUSCULAR | Status: AC | PRN

## 2014-04-20 MED ORDER — OXYTOCIN 40 UNITS IN LACTATED RINGERS INFUSION - SIMPLE MED
62.5000 mL/h | INTRAVENOUS | Status: DC
Start: 2014-04-20 — End: 2014-04-21

## 2014-04-20 MED ORDER — CITRIC ACID-SODIUM CITRATE 334-500 MG/5ML PO SOLN: 30.0000 mL | ORAL | Status: DC | PRN

## 2014-04-20 MED ORDER — OXYTOCIN 40 UNITS IN LACTATED RINGERS INFUSION - SIMPLE MED
1.0000 m[IU]/min | INTRAVENOUS | Status: DC
  Filled 2014-04-20: qty 1000

## 2014-04-20 MED ORDER — OXYCODONE-ACETAMINOPHEN 5-325 MG PO TABS: 2.0000 | ORAL_TABLET | ORAL | Status: DC | PRN

## 2014-04-20 NOTE — Telephone Encounter (Signed)
Preadmission screen  

## 2014-04-21 ENCOUNTER — Encounter (HOSPITAL_COMMUNITY): Payer: Self-pay

## 2014-04-21 ENCOUNTER — Inpatient Hospital Stay (HOSPITAL_COMMUNITY): Payer: BLUE CROSS/BLUE SHIELD | Admitting: Anesthesiology

## 2014-04-21 LAB — GLUCOSE, CAPILLARY
GLUCOSE-CAPILLARY: 82 mg/dL (ref 70–99)
GLUCOSE-CAPILLARY: 88 mg/dL (ref 70–99)
Glucose-Capillary: 90 mg/dL (ref 70–99)
Glucose-Capillary: 97 mg/dL (ref 70–99)
Glucose-Capillary: 78 mg/dL (ref 70–99)

## 2014-04-21 LAB — TYPE AND SCREEN
ABO/RH(D): O NEG
Antibody Screen: NEGATIVE

## 2014-04-21 LAB — RPR: RPR: NONREACTIVE

## 2014-04-21 MED ORDER — SIMETHICONE 80 MG PO CHEW: 80.0000 mg | CHEWABLE_TABLET | ORAL | Status: DC | PRN

## 2014-04-21 MED ORDER — DIPHENHYDRAMINE HCL 25 MG PO CAPS: 25.0000 mg | ORAL_CAPSULE | Freq: Four times a day (QID) | ORAL | Status: DC | PRN

## 2014-04-21 MED ORDER — EPHEDRINE 5 MG/ML INJ
10.0000 mg | INTRAVENOUS | Status: DC | PRN
  Filled 2014-04-21: qty 2

## 2014-04-21 MED ORDER — LIDOCAINE HCL (PF) 1 % IJ SOLN
INTRAMUSCULAR | Status: DC | PRN
  Administered 2014-04-21: 3 mL
  Administered 2014-04-21: 4 mL

## 2014-04-21 MED ORDER — PHENYLEPHRINE 40 MCG/ML (10ML) SYRINGE FOR IV PUSH (FOR BLOOD PRESSURE SUPPORT)
80.0000 ug | PREFILLED_SYRINGE | INTRAVENOUS | Status: DC | PRN
  Filled 2014-04-21: qty 2
  Filled 2014-04-21: qty 20

## 2014-04-21 MED ORDER — ONDANSETRON HCL 4 MG/2ML IJ SOLN: 4.0000 mg | INTRAMUSCULAR | Status: DC | PRN

## 2014-04-21 MED ORDER — FENTANYL 2.5 MCG/ML BUPIVACAINE 1/10 % EPIDURAL INFUSION (WH - ANES)
14.0000 mL/h | INTRAMUSCULAR | Status: DC | PRN
Start: 2014-04-21 — End: 2014-04-21
  Administered 2014-04-21: 14 mL/h via EPIDURAL
  Administered 2014-04-21: 11.5 mL/h via EPIDURAL
  Filled 2014-04-21: qty 125

## 2014-04-21 MED ORDER — SENNOSIDES-DOCUSATE SODIUM 8.6-50 MG PO TABS
2.0000 | ORAL_TABLET | ORAL | Status: DC
  Administered 2014-04-21 – 2014-04-22 (×2): 2 via ORAL
  Filled 2014-04-21 (×2): qty 2

## 2014-04-21 MED ORDER — BENZOCAINE-MENTHOL 20-0.5 % EX AERO
1.0000 "application " | INHALATION_SPRAY | CUTANEOUS | Status: DC | PRN
  Administered 2014-04-21: 1 via TOPICAL
  Filled 2014-04-21: qty 56

## 2014-04-21 MED ORDER — ACETAMINOPHEN 325 MG PO TABS: 650.0000 mg | ORAL_TABLET | ORAL | Status: DC | PRN

## 2014-04-21 MED ORDER — PRENATAL MULTIVITAMIN CH
1.0000 | ORAL_TABLET | Freq: Every day | ORAL | Status: DC
  Administered 2014-04-22: 1 via ORAL
  Filled 2014-04-21: qty 1

## 2014-04-21 MED ORDER — DIBUCAINE 1 % RE OINT: 1.0000 "application " | TOPICAL_OINTMENT | RECTAL | Status: DC | PRN

## 2014-04-21 MED ORDER — DIPHENHYDRAMINE HCL 50 MG/ML IJ SOLN: 12.5000 mg | INTRAMUSCULAR | Status: DC | PRN

## 2014-04-21 MED ORDER — LACTATED RINGERS IV SOLN
500.0000 mL | Freq: Once | INTRAVENOUS | Status: AC
  Administered 2014-04-21: 500 mL via INTRAVENOUS

## 2014-04-21 MED ORDER — WITCH HAZEL-GLYCERIN EX PADS
1.0000 "application " | MEDICATED_PAD | CUTANEOUS | Status: DC | PRN
  Administered 2014-04-22: 1 via TOPICAL

## 2014-04-21 MED ORDER — ONDANSETRON HCL 4 MG PO TABS: 4.0000 mg | ORAL_TABLET | ORAL | Status: DC | PRN

## 2014-04-21 MED ORDER — PHENYLEPHRINE 40 MCG/ML (10ML) SYRINGE FOR IV PUSH (FOR BLOOD PRESSURE SUPPORT)
80.0000 ug | PREFILLED_SYRINGE | INTRAVENOUS | Status: DC | PRN
  Filled 2014-04-21: qty 2

## 2014-04-21 MED ORDER — ZOLPIDEM TARTRATE 5 MG PO TABS: 5.0000 mg | ORAL_TABLET | Freq: Every evening | ORAL | Status: DC | PRN

## 2014-04-21 MED ORDER — TETANUS-DIPHTH-ACELL PERTUSSIS 5-2.5-18.5 LF-MCG/0.5 IM SUSP: 0.5000 mL | Freq: Once | INTRAMUSCULAR | Status: DC

## 2014-04-21 MED ORDER — IBUPROFEN 600 MG PO TABS
600.0000 mg | ORAL_TABLET | Freq: Four times a day (QID) | ORAL | Status: DC
  Administered 2014-04-21 – 2014-04-23 (×6): 600 mg via ORAL
  Filled 2014-04-21 (×6): qty 1

## 2014-04-21 MED ORDER — LANOLIN HYDROUS EX OINT: TOPICAL_OINTMENT | CUTANEOUS | Status: DC | PRN

## 2014-04-21 NOTE — Anesthesia Preprocedure Evaluation (Signed)
Anesthesia Evaluation  Patient identified by MRN, date of birth, ID band Patient awake    Reviewed: Allergy & Precautions, Patient's Chart, lab work & pertinent test results  Airway Mallampati: III  TM Distance: >3 FB Neck ROM: Full    Dental no notable dental hx. (+) Teeth Intact   Pulmonary neg pulmonary ROS,  breath sounds clear to auscultation  Pulmonary exam normal       Cardiovascular negative cardio ROS  Rhythm:Regular Rate:Normal     Neuro/Psych  Headaches, Anxiety    GI/Hepatic Neg liver ROS, GERD-  Medicated and Controlled,  Endo/Other  diabetes, Well Controlled, GestationalObesity  Renal/GU negative Renal ROS  negative genitourinary   Musculoskeletal negative musculoskeletal ROS (+)   Abdominal (+) + obese,   Peds  Hematology  (+) anemia ,   Anesthesia Other Findings   Reproductive/Obstetrics (+) Pregnancy                             Anesthesia Physical Anesthesia Plan  ASA: II  Anesthesia Plan: Epidural   Post-op Pain Management:    Induction:   Airway Management Planned: Natural Airway  Additional Equipment:   Intra-op Plan:   Post-operative Plan:   Informed Consent: I have reviewed the patients History and Physical, chart, labs and discussed the procedure including the risks, benefits and alternatives for the proposed anesthesia with the patient or authorized representative who has indicated his/her understanding and acceptance.     Plan Discussed with: Anesthesiologist  Anesthesia Plan Comments:         Anesthesia Quick Evaluation

## 2014-04-21 NOTE — Anesthesia Procedure Notes (Signed)
Epidural Patient location during procedure: OB Start time: 04/21/2014 9:18 AM  Staffing Anesthesiologist: Mal AmabileFOSTER, Navon Kotowski Performed by: anesthesiologist   Preanesthetic Checklist Completed: patient identified, site marked, surgical consent, pre-op evaluation, timeout performed, IV checked, risks and benefits discussed and monitors and equipment checked  Epidural Patient position: sitting Prep: site prepped and draped and DuraPrep Patient monitoring: continuous pulse ox and blood pressure Approach: midline Location: L4-L5 Injection technique: LOR air  Needle:  Needle type: Tuohy  Needle gauge: 17 G Needle length: 9 cm and 9 Needle insertion depth: 6 cm Catheter type: closed end flexible Catheter size: 19 Gauge Catheter at skin depth: 11 cm Test dose: negative and Other  Assessment Events: blood not aspirated, injection not painful, no injection resistance, negative IV test and no paresthesia  Additional Notes Patient identified. Risks and benefits discussed including failed block, incomplete  Pain control, post dural puncture headache, nerve damage, paralysis, blood pressure Changes, nausea, vomiting, reactions to medications-both toxic and allergic and post Partum back pain. All questions were answered. Patient expressed understanding and wished to proceed. Sterile technique was used throughout procedure. Epidural site was Dressed with sterile barrier dressing. No paresthesias, signs of intravascular injection Or signs of intrathecal spread were encountered.  Patient was more comfortable after the epidural was dosed. Please see RN's note for documentation of vital signs and FHR which are stable.

## 2014-04-21 NOTE — H&P (Signed)
Dana Bass is a 24 y.o. female G5 P1031 at 74 5/7 weeks dated by a 10 week ultrasound presenting for induction of labor due to gestational diabetes which has been well controlled with diet. Pt is currently awaiting epidural.  Contractions strong.  No LOF. PNC has been good with [facility] since the first trimester.  Complicated as above and SI joint pain.  Pt has h/o anxiety.  Has been controlled on Prozac throughout the pregnancy.  Pt anxiety is profound, can not drive due her condition.  Maternal Medical History:  Reason for admission: Induction of labor.  Contractions: Frequency: irregular.   Perceived severity is mild.    Fetal activity: Perceived fetal activity is normal.    Prenatal Complications - Diabetes: gestational. Diabetes is managed by diet.      OB History    Gravida Para Term Preterm AB TAB SAB Ectopic Multiple Living   Past Medical History  Diagnosis Date  . Headache(784.0)   . Hx of varicella   . Gestational diabetes   . Anemia   . Anxiety     prozac    Past Surgical History  Procedure Laterality Date  . Dilation and curettage of uterus    . Cholecystectomy  2010   Family History: family history includes Asthma in her brother; Bipolar disorder in her sister; Diabetes in her father, paternal grandfather, and paternal grandmother; Heart disease in her mother; Hypertension in her father and mother; Kidney disease in her sister; Seizures in her mother; Varicose Veins in her mother. Social History:  reports that she has never smoked. She has never used smokeless tobacco. She reports that she does not drink alcohol or use illicit drugs.   Prenatal Transfer Tool  Maternal Diabetes: Yes:  Diabetes Type:  Diet controlled Genetic Screening: Declined Maternal Ultrasounds/Referrals: Normal Fetal Ultrasounds or other Referrals:  None Maternal Substance Abuse:  No Significant Maternal Medications:  Meds include: Prozac Significant  Maternal Lab Results:  Lab values include: Group B Strep positive Other Comments:  None  Review of Systems  Gastrointestinal: Positive for abdominal pain.    Dilation: 4 Effacement (%): 70 Station: -2 Exam by:: Bertram Millard, RN Blood pressure 112/60, pulse 102, temperature 97.9 F (36.6 C), temperature source Oral, resp. rate 18, height  (1.499 m), weight 85.276 kg (188 lb), unknown if currently breastfeeding. Maternal Exam:  Uterine Assessment: Contraction frequency is regular.   Abdomen: Patient reports no abdominal tenderness. Estimated fetal weight is 7 lbs 12 oz at 38 1/7 weeks by ultrasouns EFW 84%.   Fetal presentation: vertex  Introitus: Vulva is positive for vulval edema. Amniotic fluid character: not assessed. Hypertrophic labia minora bilaterally.  Pelvis: adequate for delivery.   Cervix: Cervix evaluated by digital exam.     Fetal Exam Fetal Monitor Review: Mode: fetoscope.   Variability: moderate (6-25 bpm).   Pattern: accelerations present and no decelerations.    Fetal State Assessment: Category I - tracings are normal.     Physical Exam  Constitutional: She is oriented to person, place, and time. She appears well-developed and well-nourished.  Pt appears somnolent s/p Stadol.  HENT:  Head: Normocephalic and atraumatic.  Eyes: EOM are normal.  Neck: Normal range of motion.  Respiratory: Effort normal. No respiratory distress.  GI: Soft. There is no tenderness.  Neurological: She is alert and oriented to person, place, and time.  Skin: Skin is warm and dry.  Psychiatric: She has a normal mood and affect.    Prenatal labs: ABO, Rh: --/--/O NEG (04/12 2015) Antibody: NEG (04/12 2015) Rubella: Immune (09/17 0000) RPR: Non Reactive (04/12 2015)  HBsAg: Negative (09/17 0000)  HIV: Non-reactive (09/17 0000)  GBS: Positive (03/25 0000)   CBG 90 -0400  Assessment/Plan: IUP at 39 5/7 weeks IOL due to Gestational diabetes A1- well controlled. LGA.   Proven pelvis 8 lbs 4 oz.  Previously discussed risk of shoulder dystocia, risk of induction. Active labor. GBS+ Start PCN per protocol for prophylaxis. Epidural per anesthesia.  Arland Usery 04/21/2014, 8:05 AM

## 2014-04-21 NOTE — Progress Notes (Signed)
Notified Dana Bass. Emly of Pts request for Epidural. Will continue with Stadol until labor more active. Patient good with another Stadol dose.

## 2014-04-21 NOTE — Progress Notes (Signed)
Dana GlassingDiana Bass MRN: [id number]  Subjective: -Nurse call reporting patient c/o pain with contractions and requesting epidural.  S/P IV dosage of stadol and next dose cytotec due in 1 hour.    Objective: BP 115/62 mmHg  Pulse 110  Temp(Src) 98.2 F (36.8 C) (Oral)  Resp 18  Ht 4\' 11"  (1.499 m)  Wt 188 lb (85.276 kg)  BMI 37.95 kg/m2     FHT:125  bpm, Mod Var, -Decels, -Accels UC: Q1-633min SVE: Deferred Membranes: Intact Pitocin: None Cytotec: S/P 1st Dose  Assessment:  IUP at 39.5wks Cat I FT  Cervical Ripening  Plan: -Okay for additional dosage of stadol -Reassess in one hour for necessity of additional cytotec dosing -If pain unrelieved with stadol, reassess earlier -Can have epidural if and when >/= 4cm dilation -Continue other mgmt as ordered  Philip Eckersley LYNN,MSN, CNM 04/21/2014, 3:37 AM

## 2014-04-22 LAB — CBC
HEMATOCRIT: 29 % — AB (ref 36.0–46.0)
HEMOGLOBIN: 9.7 g/dL — AB (ref 12.0–15.0)
MCH: 28.7 pg (ref 26.0–34.0)
MCHC: 33.4 g/dL (ref 30.0–36.0)
MCV: 85.8 fL (ref 78.0–100.0)
Platelets: 206 10*3/uL (ref 150–400)
RBC: 3.38 MIL/uL — ABNORMAL LOW (ref 3.87–5.11)
RDW: 15.9 % — AB (ref 11.5–15.5)
WBC: 11.4 10*3/uL — ABNORMAL HIGH (ref 4.0–10.5)

## 2014-04-22 MED ORDER — RHO D IMMUNE GLOBULIN 1500 UNIT/2ML IJ SOSY
300.0000 ug | PREFILLED_SYRINGE | Freq: Once | INTRAMUSCULAR | Status: AC
  Administered 2014-04-22: 300 ug via INTRAVENOUS
  Filled 2014-04-22: qty 2

## 2014-04-22 NOTE — Anesthesia Postprocedure Evaluation (Signed)
  Anesthesia Post-op Note  Patient: Dana GlassingDiana Bass  Procedure(s) Performed: * No procedures listed *  Patient Location: PACU and Mother/Baby  Anesthesia Type:Epidural  Level of Consciousness: awake, alert , oriented and patient cooperative  Airway and Oxygen Therapy: Patient Spontanous Breathing  Post-op Pain: none  Post-op Assessment: Post-op Vital signs reviewed, Patient's Cardiovascular Status Stable, Respiratory Function Stable, Patent Airway, No signs of Nausea or vomiting, Adequate PO intake, Pain level controlled, No headache, No backache, No residual numbness and No residual motor weakness  Post-op Vital Signs: Reviewed and stable  Last Vitals:  Filed Vitals:   04/22/14 0612  BP: 111/57  Pulse: 95  Temp: 36.8 C  Resp: 20    Complications: No apparent anesthesia complications

## 2014-04-22 NOTE — Clinical Social Work Psychosocial (Signed)
Clinical Social Work Department BRIEF PSYCHOSOCIAL ASSESSMENT 04/22/2014  Patient:  Dana Bass,Dana Bass     Account Number:  [id number]     Admit date:  04/20/2014  Clinical Social Worker:  Ellana Kawa, LCSWA  Date/Time:  04/22/2014 12:04 PM  Referred by:  Physician  Date Referred:  04/22/2014 Referred for  Other - See comment   Other Referral:   MOB with hx anxiety   Interview type:  Patient Other interview type:    PSYCHOSOCIAL DATA Living Status:  FAMILY Admitted from facility:   Level of care:   Primary support name:  husband- Primary support relationship to patient:  FAMILY Degree of support available:   good    CURRENT CONCERNS Current Concerns  Other - See comment   Other Concerns:   MOB with history of anxiety    SOCIAL WORK ASSESSMENT / PLAN CSW met with MOB at bedside to assess for history of anxiety. MOB sitting up in bed holding "Bruce". She shared with CSW immediately; "oh, I'm much better now". She reports being diagnosed with GAD years ago "probably most of my life I have been a worrier" and was diagnosed and treated". She had been taking Prozac but reports she stopped taking it when she found out she was pregnant 6 months ago.  "I am doing very well and have found hobbies to help me cope". MOB enjoys crocheting and singing to relieve her anxiety. "it has worked really well".  MOB shares that she lives with her husband who works weekend (12 hour shifts Fri-Sun) but is otherwise at home and able to help her with their 20month old son.  Her mother is here visiting from [place] and is a good support.   Assessment/plan status:  No Further Intervention Required Other assessment/ plan:   Information/referral to community resources:   CSW educated MOB on signs/symptoms and her high risk given her history- she acknowledges and feels they are prepared and know what to watch for- She reports her current anxiety at less than 3 where it had been a 7. MOB plans to further assess her  need to start medications back again if needed and will communicate with her MD to provide this if needed.    PATIENT'S/FAMILY'S RESPONSE TO PLAN OF CARE: MOB is excited to get her newborn home- she reports having all needed supplies for baby. She is appreciative of CSW visit and support.      Hendrix Console, MSW, LCSWA  

## 2014-04-22 NOTE — Lactation Note (Addendum)
This note was copied from the chart of Dana Bass Farooq. Lactation Consultation Note  Patient Name: Dana Bass Ranieri GNFAO'ZToday's Date: 04/22/2014 Reason for consult: Follow-up assessment  Baby 21 hours old , and as LC walked in mom was working on latching the baby on the left breast. Mom has semi flat nipples , with compressible areolas, and swelling at the base of the nipple . LC had mom massage breast , hand express, and roll nipple , LC assisted with latch, positioning,  And worked on obtaining depth. Multiply swallows noted, increased with breast compressions, Baby stayed latched  For 30 mins , consistent pattern. Per mom comfortable. Nipple appeared more erect and well rounded when baby  released from the breast. LC recommended to mom already being tender prepping nipple and areola prior to latch-  Breast massage , hand express, pre- pump with a hand pump ( LC reviewed and set up for mom , increase to #27 Flange  If the #24 is to snug ), and reverse pressure, latch with firm support, and work on getting baby to open wide before latching.  Using the tea cup hold , and the breast compressions technique until the baby is in a consistent swallowing pattern and then intermittent. Comfort gels after feedings, then shells. Mom seemed to be very pleased baby fed so well. Baby released and sound asleep. Review of Baby's chart - 1% weight loss, 5 wets, 6 stools, ( including the 1wet , 2 stools LC changed ), Baby has fed 3 10 -15 mins feedings, 6 breast feeding <10 mins.     Maternal Data Has patient been taught Hand Expression?: Yes  Feeding Feeding Type: Breast Fed Length of feed: 30 min (mutliply swallows )  LATCH Score/Interventions Latch: Grasps breast easily, tongue down, lips flanged, rhythmical sucking. Intervention(s): Adjust position;Assist with latch;Breast massage;Breast compression  Audible Swallowing: Spontaneous and intermittent  Type of Nipple: Flat (areola compressible  )  Comfort (Breast/Nipple): Soft / non-tender     Hold (Positioning): Assistance needed to correctly position infant at breast and maintain latch. (worked on positioning and depth ) Intervention(s): Breastfeeding basics reviewed;Support Pillows;Position options;Skin to skin  LATCH Score: 8  Lactation Tools Discussed/Used Tools: Shells;Pump;Flanges;Comfort gels Flange Size: 27 Shell Type: Inverted Breast pump type: Manual Pump Review: Setup, frequency, and cleaning (reviewed set up , and due to edema , increased flange size to #27 )   Consult Status Consult Status: Follow-up Date: 04/23/14 Follow-up type: In-patient    Kathrin Greathouseorio, Jaaliyah Lucatero Ann 04/22/2014, 4:54 PM

## 2014-04-22 NOTE — Progress Notes (Signed)
Postpartum day #1, NSVD  Subjective Pt without complaints.  Lochia normal, has passed small clots.  Pain controlled.  Breast feeding yes  Temp:  [97.5 F (36.4 C)-98.3 F (36.8 C)] 98.3 F (36.8 C) (04/14 0612) Pulse Rate:  [86-193] 95 (04/14 0612) Resp:  [16-20] 20 (04/14 0612) BP: (102-128)/(57-85) 111/57 mmHg (04/14 0612)  Gen:  NAD, A&O x 3 Uterine fundus:  Firm, nontender Lochia normal Ext:  +Edema, no calf tenderness bilaterally  CBC    Component Value Date/Time   WBC 11.4* 04/22/2014 0545   RBC 3.38* 04/22/2014 0545   HGB 9.7* 04/22/2014 0545   HCT 29.0* 04/22/2014 0545   PLT 206 04/22/2014 0545   MCV 85.8 04/22/2014 0545   MCH 28.7 04/22/2014 0545   MCHC 33.4 04/22/2014 0545   RDW 15.9* 04/22/2014 0545     A/P: S/p SVD doing well. Routine postpartum care. Lactation support. Discharge in am due to GBS+ status baby per pediatrics.  Geryl RankinsVARNADO, Sonya Gunnoe 04/22/2014, 1:07 PM

## 2014-04-22 NOTE — Lactation Note (Signed)
This note was copied from the chart of Dana Ubaldo GlassingDiana Holleman. Lactation Consultation Note Mom stated this baby is BF really good. Stated her first baby wouldn't latch and was unable to BF.  Noted mom has semi flat nipples. Mom demonstrated hand expression w/noted drop of colostrum.  Denies painful latching. Noted positional stripe to nipple. Explained positioning, chin tug, STS, comfort during BF. Shells given to wear in bra to assist in everting nipple more. Hand pump given as well to pre-pump to pull nipple out for deeper latch.  Mom encouraged to feed baby 8-12 times/24 hours and with feeding cues. Referred to Baby and Me Book in Breastfeeding section Pg. 22-23 for position options and Proper latch demonstration.Encouraged comfort during BF so colostrum flows better and mom will enjoy the feeding longer. Taking deep breaths and breast massage during BF. Educated about newborn behavior. WH/LC brochure given w/resources, support groups and LC services. Patient Name: Dana Ubaldo GlassingDiana Nwosu NWGNF'AToday's Date: 04/22/2014 Reason for consult: Initial assessment   Maternal Data Has patient been taught Hand Expression?: Yes Does the patient have breastfeeding experience prior to this delivery?: Yes  Feeding Feeding Type: Breast Fed Length of feed: 7 min  LATCH Score/Interventions       Type of Nipple: Flat (semi flat) Intervention(s): Shells;Hand pump  Comfort (Breast/Nipple): Filling, red/small blisters or bruises, mild/mod discomfort (positional stripes)     Intervention(s): Breastfeeding basics reviewed;Support Pillows;Position options;Skin to skin     Lactation Tools Discussed/Used Tools: Shells;Pump Shell Type: Inverted Breast pump type: Manual Pump Review: Setup, frequency, and cleaning;Milk Storage Initiated by:: Peri JeffersonL. Omran Keelin RN Date initiated:: 04/22/14   Consult Status Consult Status: Follow-up Date: 04/22/14 (in pm) Follow-up type: In-patient    Charyl DancerCARVER, Nason Conradt G 04/22/2014, 4:12  AM

## 2014-04-23 LAB — RH IG WORKUP (INCLUDES ABO/RH)
ABO/RH(D): O NEG
Fetal Screen: NEGATIVE
GESTATIONAL AGE(WKS): 39
UNIT DIVISION: 0

## 2014-04-23 NOTE — Lactation Note (Signed)
This note was copied from the chart of Dana Ubaldo GlassingDiana Grigoryan. Lactation Consultation Note  Patient Name: Dana Bass NFAOZ'HToday's Date: 04/23/2014   Follow-up visit with Mom on day of discharge, baby at 5940 hrs old.  Mom denies any difficulty with latch.  Latch score yesterday was 8. Using breast shells on right side, as early latch caused some discomfort.  Encouraged skin to skin, and cue based feedings.  Talked about cluster feeding.  Engorgement prevention and treatment discussed.  Reminded Mom of OP lactation services available.  Encouraged to call prn.        Judee ClaraSmith, Jarold Macomber E 04/23/2014, 10:25 AM

## 2014-04-23 NOTE — Discharge Summary (Signed)
Obstetric Discharge Summary Reason for Admission: induction of labor Prenatal Procedures: ultrasound Intrapartum Procedures: spontaneous vaginal delivery, vacuum and GBS prophylaxis Postpartum Procedures: Rho(D) Ig Complications-Operative and Postpartum: vaginal laceration HEMOGLOBIN  Date Value Ref Range Status  04/22/2014 9.7* 12.0 - 15.0 g/dL Final   HCT  Date Value Ref Range Status  04/22/2014 29.0* 36.0 - 46.0 % Final    Physical Exam:   Discharge Diagnoses: Term Pregnancy-delivered  Discharge Information: Date: 04/23/2014 Activity: pelvic rest Diet: routine Medications: PNV and Ibuprofen Condition: stable Instructions: See discharge instructions Discharge to: home Follow-up Information    Follow up with Geryl RankinsVARNADO, Damiana Berrian, MD.   Specialty:  Obstetrics and Gynecology   Why:  Postpartum Depression screen   Contact information:   [address] [place] [address] [phone]       Newborn Data: Live born female  Birth Weight: 7 lb 8.5 oz (3416 g) APGAR: 7, 9  Home with mother. Declined circumcision.  Geryl RankinsVARNADO, Maryse Brierley 04/23/2014, 7:15 AM

## 2014-04-23 NOTE — Discharge Instructions (Signed)
Postpartum Care After Vaginal Delivery  After you deliver your newborn (postpartum period), the usual stay in the hospital is 24-72 hours. If there were problems with your labor or delivery, or if you have other medical problems, you might be in the hospital longer.   While you are in the hospital, you will receive help and instructions on how to care for yourself and your newborn during the postpartum period.   While you are in the hospital:  · Be sure to tell your nurses if you have pain or discomfort, as well as where you feel the pain and what makes the pain worse.  · If you had an incision made near your vagina (episiotomy) or if you had some tearing during delivery, the nurses may put ice packs on your episiotomy or tear. The ice packs may help to reduce the pain and swelling.  · If you are breastfeeding, you may feel uncomfortable contractions of your uterus for a couple of weeks. This is normal. The contractions help your uterus get back to normal size.  · It is normal to have some bleeding after delivery.  ¨ For the first 1-3 days after delivery, the flow is red and the amount may be similar to a period.  ¨ It is common for the flow to start and stop.  ¨ In the first few days, you may pass some small clots. Let your nurses know if you begin to pass large clots or your flow increases.  ¨ Do not  flush blood clots down the toilet before having the nurse look at them.  ¨ During the next 3-10 days after delivery, your flow should become more watery and pink or brown-tinged in color.  ¨ Ten to fourteen days after delivery, your flow should be a small amount of yellowish-white discharge.  ¨ The amount of your flow will decrease over the first few weeks after delivery. Your flow may stop in 6-8 weeks. Most women have had their flow stop by 12 weeks after delivery.  · You should change your sanitary pads frequently.  · Wash your hands thoroughly with soap and water for at least 20 seconds after changing pads, using  the toilet, or before holding or feeding your newborn.  · You should feel like you need to empty your bladder within the first 6-8 hours after delivery.  · In case you become weak, lightheaded, or faint, call your nurse before you get out of bed for the first time and before you take a shower for the first time.  · Within the first few days after delivery, your breasts may begin to feel tender and full. This is called engorgement. Breast tenderness usually goes away within 48-72 hours after engorgement occurs. You may also notice milk leaking from your breasts. If you are not breastfeeding, do not stimulate your breasts. Breast stimulation can make your breasts produce more milk.  · Spending as much time as possible with your newborn is very important. During this time, you and your newborn can feel close and get to know each other. Having your newborn stay in your room (rooming in) will help to strengthen the bond with your newborn.  It will give you time to get to know your newborn and become comfortable caring for your newborn.  · Your hormones change after delivery. Sometimes the hormone changes can temporarily cause you to feel sad or tearful. These feelings should not last more than a few days. If these feelings last longer than   that, you should talk to your caregiver.  · If desired, talk to your caregiver about methods of family planning or contraception.  · Talk to your caregiver about immunizations. Your caregiver may want you to have the following immunizations before leaving the hospital:  ¨ Tetanus, diphtheria, and pertussis (Tdap) or tetanus and diphtheria (Td) immunization. It is very important that you and your family (including grandparents) or others caring for your newborn are up-to-date with the Tdap or Td immunizations. The Tdap or Td immunization can help protect your newborn from getting ill.  ¨ Rubella immunization.  ¨ Varicella (chickenpox) immunization.  ¨ Influenza immunization. You should  receive this annual immunization if you did not receive the immunization during your pregnancy.  Document Released: 10/22/2006 Document Revised: 09/19/2011 Document Reviewed: 08/22/2011  ExitCare® Patient Information ©2015 [employer]. This information is not intended to replace advice given to you by your health care provider. Make sure you discuss any questions you have with your health care provider.  Postpartum Depression and Baby Blues  The postpartum period begins right after the birth of a baby. During this time, there is often a great amount of joy and excitement. It is also a time of many changes in the life of the parents. Regardless of how many times a mother gives birth, each child brings new challenges and dynamics to the family. It is not unusual to have feelings of excitement along with confusing shifts in moods, emotions, and thoughts. All mothers are at risk of developing postpartum depression or the "baby blues." These mood changes can occur right after giving birth, or they may occur many months after giving birth. The baby blues or postpartum depression can be mild or severe. Additionally, postpartum depression can go away rather quickly, or it can be a long-term condition.   CAUSES  Raised hormone levels and the rapid drop in those levels are thought to be a main cause of postpartum depression and the baby blues. A number of hormones change during and after pregnancy. Estrogen and progesterone usually decrease right after the delivery of your baby. The levels of thyroid hormone and various cortisol steroids also rapidly drop. Other factors that play a role in these mood changes include major life events and genetics.   RISK FACTORS  If you have any of the following risks for the baby blues or postpartum depression, know what symptoms to watch out for during the postpartum period. Risk factors that may increase the likelihood of getting the baby blues or postpartum depression include:  · Having  a personal or family history of depression.    · Having depression while being pregnant.    · Having premenstrual mood issues or mood issues related to oral contraceptives.  · Having a lot of life stress.    · Having marital conflict.    · Lacking a social support network.    · Having a baby with special needs.    · Having health problems, such as diabetes.    SIGNS AND SYMPTOMS  Symptoms of baby blues include:  · Brief changes in mood, such as going from extreme happiness to sadness.  · Decreased concentration.    · Difficulty sleeping.    · Crying spells, tearfulness.    · Irritability.    · Anxiety.    Symptoms of postpartum depression typically begin within the first month after giving birth. These symptoms include:  · Difficulty sleeping or excessive sleepiness.    · Marked weight loss.    · Agitation.    · Feelings of worthlessness.    · Lack   of interest in activity or food.    Postpartum psychosis is a very serious condition and can be dangerous. Fortunately, it is rare. Displaying any of the following symptoms is cause for immediate medical attention. Symptoms of postpartum psychosis include:   · Hallucinations and delusions.    · Bizarre or disorganized behavior.    · Confusion or disorientation.    DIAGNOSIS   A diagnosis is made by an evaluation of your symptoms. There are no medical or lab tests that lead to a diagnosis, but there are various questionnaires that a health care provider may use to identify those with the baby blues, postpartum depression, or psychosis. Often, a screening tool called the Edinburgh Postnatal Depression Scale is used to diagnose depression in the postpartum period.   TREATMENT  The baby blues usually goes away on its own in 1-2 weeks. Social support is often all that is needed. You will be encouraged to get adequate sleep and rest. Occasionally, you may be given medicines to help you sleep.   Postpartum depression requires treatment because it can last several months or  longer if it is not treated. Treatment may include individual or group therapy, medicine, or both to address any social, physiological, and psychological factors that may play a role in the depression. Regular exercise, a healthy diet, rest, and social support may also be strongly recommended.   Postpartum psychosis is more serious and needs treatment right away. Hospitalization is often needed.  HOME CARE INSTRUCTIONS  · Get as much rest as you can. Nap when the baby sleeps.    · Exercise regularly. Some women find yoga and walking to be beneficial.    · Eat a balanced and nourishing diet.    · Do little things that you enjoy. Have a cup of tea, take a bubble bath, read your favorite magazine, or listen to your favorite music.  · Avoid alcohol.    · Ask for help with household chores, cooking, grocery shopping, or running errands as needed. Do not try to do everything.    · Talk to people close to you about how you are feeling. Get support from your partner, family members, friends, or other new moms.  · Try to stay positive in how you think. Think about the things you are grateful for.    · Do not spend a lot of time alone.    · Only take over-the-counter or prescription medicine as directed by your health care provider.  · Keep all your postpartum appointments.    · Let your health care provider know if you have any concerns.    SEEK MEDICAL CARE IF:  You are having a reaction to or problems with your medicine.  SEEK IMMEDIATE MEDICAL CARE IF:  · You have suicidal feelings.    · You think you may harm the baby or someone else.  MAKE SURE YOU:  · Understand these instructions.  · Will watch your condition.  · Will get help right away if you are not doing well or get worse.  Document Released: 09/29/2003 Document Revised: 12/30/2012 Document Reviewed: 10/06/2012  ExitCare® Patient Information ©2015 [employer]. This information is not intended to replace advice given to you by your health care provider. Make sure  you discuss any questions you have with your health care provider.

## 2014-04-23 NOTE — Progress Notes (Signed)
Postpartum day #2, NSVD  Subjective Pt without complaints.  Lochia minimal.  Pain controlled.  Breast feeding yes Tolerating general diet.  No F/C/CP/SOB>  Temp:  [97.8 F (36.6 C)-98.6 F (37 C)] 97.8 F (36.6 C) (04/15 0527) Pulse Rate:  [87-104] 104 (04/15 0527) Resp:  [20] 20 (04/15 0527) BP: (114-119)/(71-74) 114/71 mmHg (04/15 0527) SpO2:  [98 %] 98 % (04/14 1723)  Gen:  NAD, A&O x 3 CV: RRR Lungs: CTAB Abd: soft, non-tender, uterus below umbilicus Lochia normal Ext:  Minimal 1+ non-pitting Edema, no calf tenderness bilaterally  CBC    Component Value Date/Time   WBC 11.4* 04/22/2014 0545   RBC 3.38* 04/22/2014 0545   HGB 9.7* 04/22/2014 0545   HCT 29.0* 04/22/2014 0545   PLT 206 04/22/2014 0545   MCV 85.8 04/22/2014 0545   MCH 28.7 04/22/2014 0545   MCHC 33.4 04/22/2014 0545   RDW 15.9* 04/22/2014 0545     A/P: 24yo [id number] s/p VAVD, PPD#2 Routine postpartum care. Lactation support. Plan for discharge home today as she is meeting all postpartum milestones appropriately.  Myna HidalgoZAN, Ehren Berisha, M 04/23/2014, 7:34 AM

## 2015-02-18 IMAGING — US US OB FOLLOW-UP
1 series · 12 of 28 positions shown · non-contrast
Comparison: none

[Series 1: us ob follow-up · 0.23mm/px · 12 of 40 slices shown]
[im 2/40]
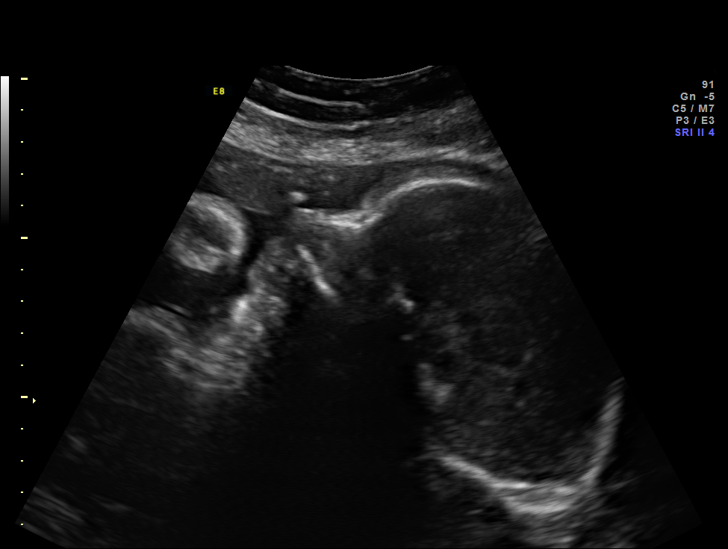
[im 5/40]
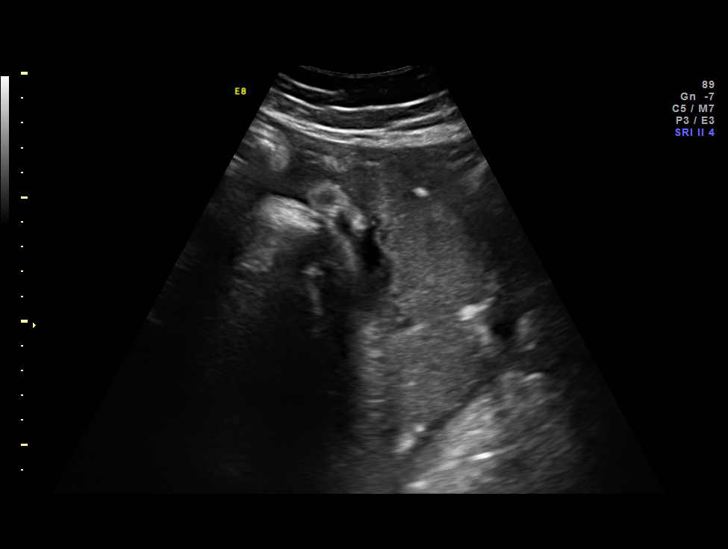
[im 8/40]
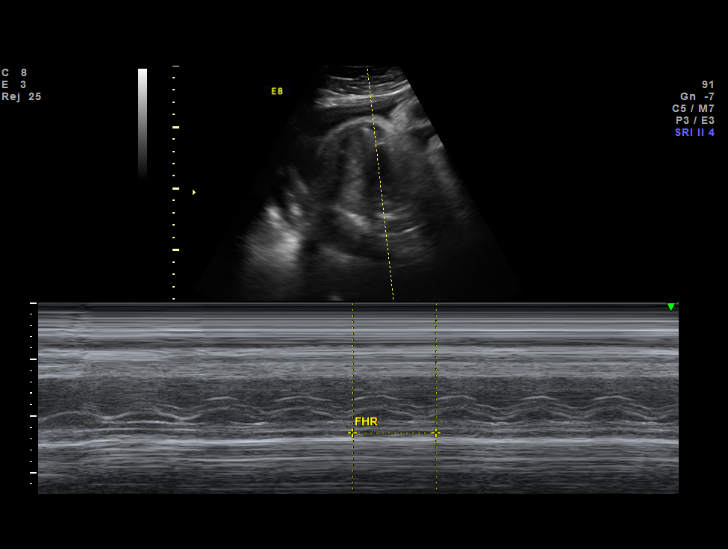
[im 12/40]
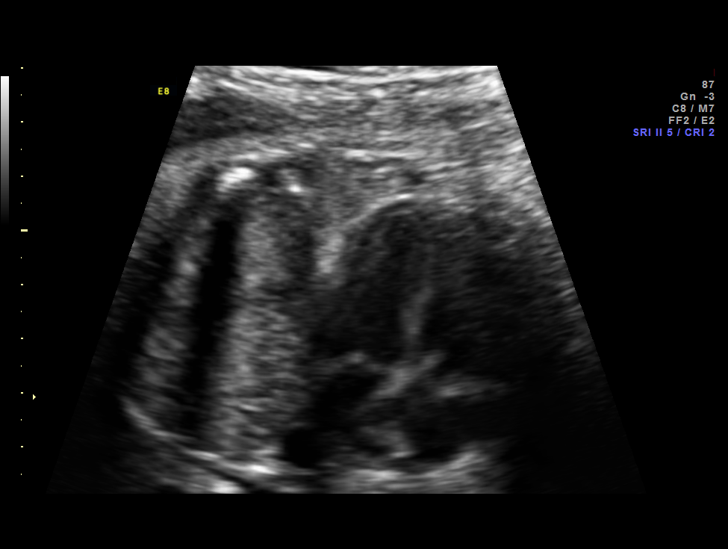
[im 15/40]
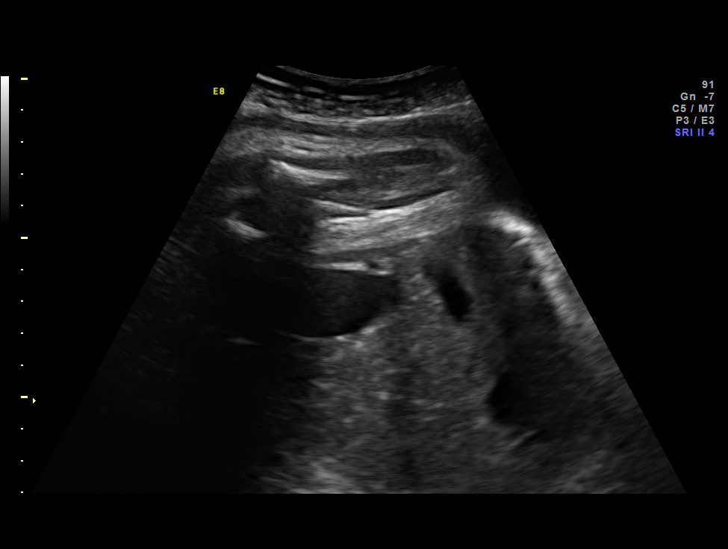
[im 18/40]
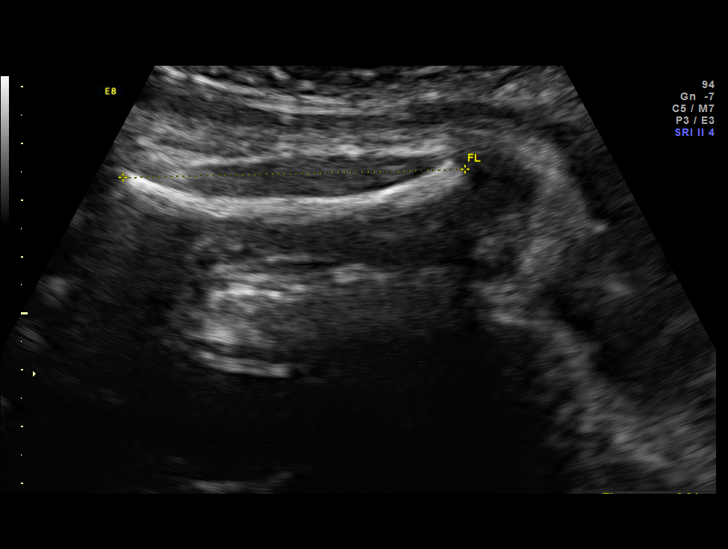
[im 22/40]
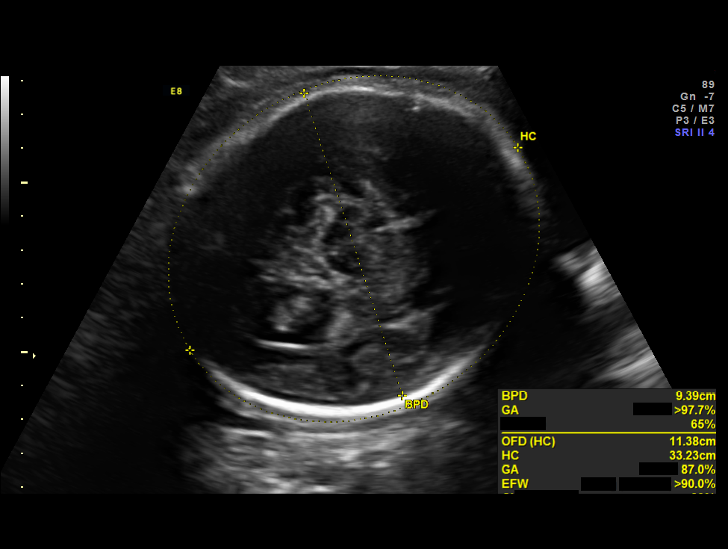
[im 25/40]
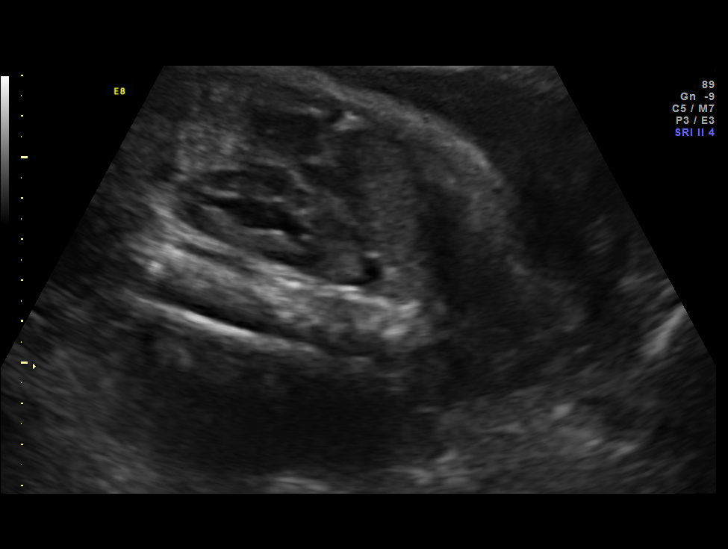
[im 28/40]
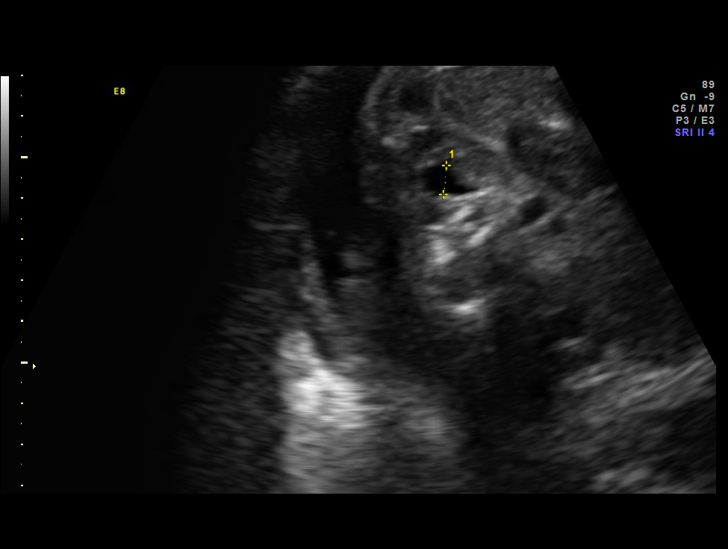
[im 32/40]
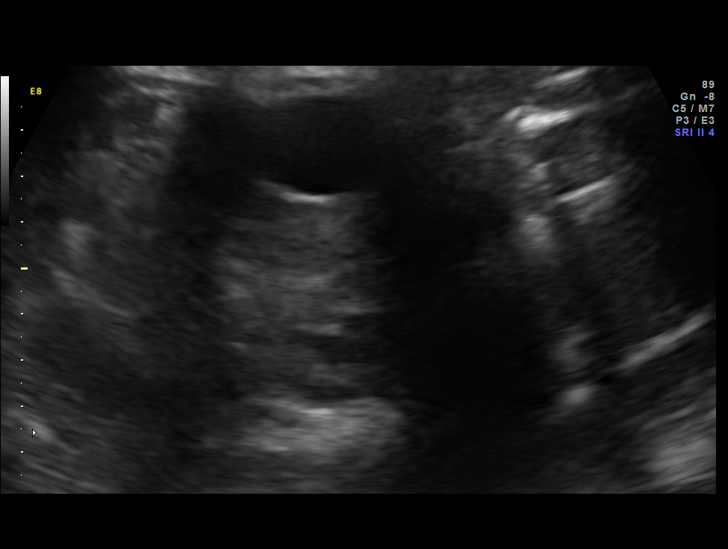
[im 35/40]
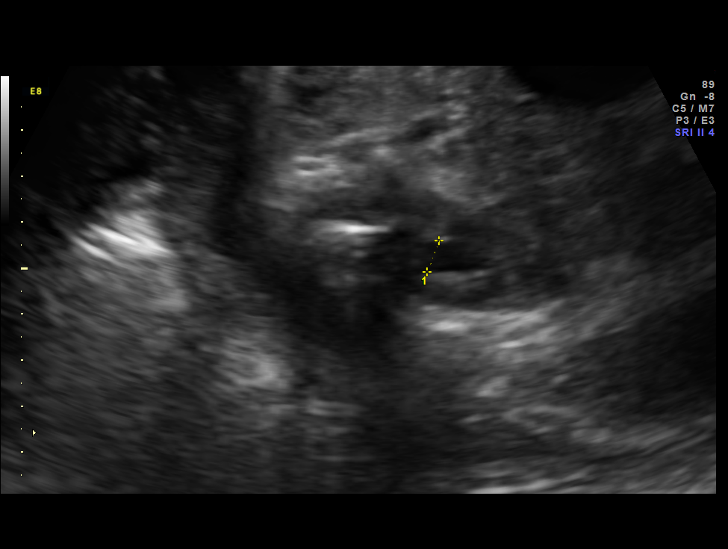
[im 38/40]
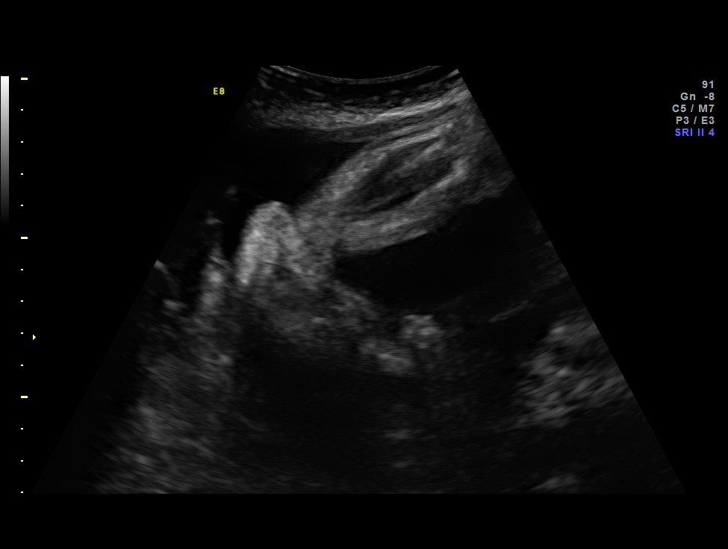

[12 of 28 positions shown; findings below may reference images not displayed]

OBSTETRICS REPORT
                      (Signed Final 09/02/2012 [DATE])

Service(s) Provided

 US OB FOLLOW UP                                       76816.1
Indications

 Fetal abnormality - other known or suspected (club
 foot, EIF)
 Rh negative, for RhoGam
Fetal Evaluation

 Num Of Fetuses:    1
 Fetal Heart Rate:  128                          bpm
 Cardiac Activity:  Observed
 Presentation:      Cephalic
 Placenta:          Posterior, above cervical
                    os
 P. Cord            Previously Visualized
 Insertion:

 Amniotic Fluid
 AFI FV:      Subjectively within normal limits
                                             Larg Pckt:     4.1  cm
Biometry

 BPD:     94.4  mm     G. Age:  38w 3d                CI:         84.0   70 - 86
 OFD:    112.4  mm                                    FL/HC:      18.5   20.1 -

 HC:       330  mm     G. Age:  37w 4d       80  %    HC/AC:      0.94   0.93 -

 AC:     349.7  mm     G. Age:  38w 6d     > 97  %    FL/BPD:     64.6   71 - 87
 FL:        61  mm     G. Age:  31w 5d      < 3  %    FL/AC:      17.4   20 - 24
 HUM:       56  mm     G. Age:  32w 4d       18  %

 Est. FW:    5222  gm    6 lb 14 oz    > 90  %
Gestational Age

 LMP:           33w 6d        Date:  01/09/12                 EDD:   10/15/12
 U/S Today:     36w 4d                                        EDD:   09/26/12
 Best:          34w 6d     Det. By:  Early Ultrasound         EDD:   10/08/12
Anatomy
 Cranium:          Previously seen        Aortic Arch:      Not well visualized
 Fetal Cavum:      Previously seen        Ductal Arch:      Previously seen
 Ventricles:       Previously seen        Diaphragm:        Previously seen
 Choroid Plexus:   Previously seen        Stomach:          Previously Seen
 Cerebellum:       Previously seen        Abdomen:          Previously seen
 Posterior Fossa:  Previously seen        Abdominal Wall:   Previously seen
 Nuchal Fold:      Not applicable (>20    Cord Vessels:     Previously seen
                   wks GA)
 Face:             Orbits appear          Kidneys:          Bilat pyelectasis,
                   normal
                                                            Rt 7.3mm, 1tJ.Bmm
 Lips:             Previously seen        Bladder:          Previously seen
 Heart:            Appears normal         Spine:            Previously seen
                   (4CH, axis, and
                   situs)
 RVOT:             Previously seen        Lower             Clubfeet
                                          Extremities:
 LVOT:             Previously seen        Upper             Previously seen
                                          Extremities:

 Other:  Male gender. 5th digit previously seen.
Impression

 Single IUP at 34 [DATE] weeks
 Bilateral clubfeet
 Mild right renal pylectrasis (7.3 cm) without calyceal dilation
 The estimated fetal weight today is > 90th %tile (3111g).  The
 AC measures > 97th %tile.
 Normal amniotic fluid volume
Recommendations

 Follow-up ultrasounds as clinically indicated.
 Recommend renal ultarsound of the newborn after delivery.
 Patient plans for Peds ortho evaluation following delivery.

 questions or concerns.
